# Patient Record
Sex: Female | Born: 1960
Health system: Southern US, Community
[De-identification: ages and names within clinical notes are randomized; demographics above are authoritative.]

## PROBLEM LIST (undated history)

## (undated) DIAGNOSIS — I10 Essential (primary) hypertension: Secondary | ICD-10-CM

## (undated) DIAGNOSIS — K649 Unspecified hemorrhoids: Secondary | ICD-10-CM

## (undated) DIAGNOSIS — D649 Anemia, unspecified: Secondary | ICD-10-CM

## (undated) DIAGNOSIS — D219 Benign neoplasm of connective and other soft tissue, unspecified: Secondary | ICD-10-CM

## (undated) HISTORY — DX: Anemia, unspecified: D64.9

## (undated) HISTORY — DX: Unspecified hemorrhoids: K64.9

## (undated) HISTORY — DX: Benign neoplasm of connective and other soft tissue, unspecified: D21.9

---

## 2007-08-28 ENCOUNTER — Emergency Department (HOSPITAL_COMMUNITY): Admission: EM | Admit: 2007-08-28 | Discharge: 2007-08-29 | Payer: Self-pay | Admitting: Emergency Medicine

## 2011-03-09 ENCOUNTER — Encounter: Payer: Self-pay | Admitting: Family Medicine

## 2011-03-09 ENCOUNTER — Ambulatory Visit (INDEPENDENT_AMBULATORY_CARE_PROVIDER_SITE_OTHER): Payer: BC Managed Care – PPO | Admitting: Family Medicine

## 2011-03-09 VITALS — BP 118/82 | HR 77 | Temp 98.7°F | Wt 183.0 lb

## 2011-03-09 DIAGNOSIS — J302 Other seasonal allergic rhinitis: Secondary | ICD-10-CM

## 2011-03-09 DIAGNOSIS — D649 Anemia, unspecified: Secondary | ICD-10-CM

## 2011-03-09 DIAGNOSIS — J309 Allergic rhinitis, unspecified: Secondary | ICD-10-CM

## 2011-03-09 NOTE — Patient Instructions (Addendum)
Allergies, Generic Allergies may happen from anything your body is sensitive to. This may be food, medicines, pollens, chemicals, and nearly anything around you in everyday life that produces allergens. An allergen is anything that causes an allergy producing substance. Heredity is often a factor in causing these problems. This means you may have some of the same allergies as your parents. Food allergies happen in all age groups. Food allergies are some of the most severe and life threatening. Some common food allergies are cow's milk, seafood, eggs, nuts, wheat, and soybeans. SYMPTOMS  Swelling around the mouth.   An itchy red rash or hives.   Vomiting or diarrhea.   Difficulty breathing.  SEVERE ALLERGIC REACTIONS ARE LIFE-THREATENING.  This reaction is called anaphylaxis. It can cause the mouth and throat to swell and cause difficulty with breathing and swallowing. In severe reactions only a trace amount of food (for example, peanut oil in a salad) may cause death within seconds. Seasonal allergies occur in all age groups. These are seasonal because they usually occur during the same season every year. They may be a reaction to molds, grass pollens, or tree pollens. Other causes of problems are house dust mite allergens, pet dander, and mold spores. The symptoms often consist of nasal congestion, a runny itchy nose associated with sneezing, and tearing itchy eyes. There is often an associated itching of the mouth and ears. The problems happen when you come in contact with pollens and other allergens. Allergens are the particles in the air that the body reacts to with an allergic reaction. This causes you to release allergic antibodies. Through a chain of events, these eventually cause you to release histamine into the blood stream. Although it is meant to be protective to the body, it is this release that causes your discomfort. This is why you were given anti-histamines to feel better. If you are  unable to pinpoint the offending allergen, it may be determined by skin or blood testing. Allergies cannot be cured but can be controlled with medicine. Hay fever is a collection of all or some of the seasonal allergy problems. It may often be treated with simple over-the-counter medicine such as diphenhydramine. Take medicine as directed. Do not drink alcohol or drive while taking this medicine. Check with your caregiver or package insert for child dosages. If these medicines are not effective, there are many new medicines your caregiver can prescribe. Stronger medicine such as nasal spray, eye drops, and corticosteroids may be used if the first things you try do not work well. Other treatments such as immunotherapy or desensitizing injections can be used if all else fails. Follow up with your caregiver if problems continue. These seasonal allergies are usually not life threatening. They are generally more of a nuisance that can often be handled using medicine. HOME CARE INSTRUCTIONS  If unsure what causes a reaction, keep a diary of foods eaten and symptoms that follow. Avoid foods that cause reactions.   If hives or rash are present:   Take medicine as directed.   You may use an over-the-counter antihistamine (diphenhydramine) for hives and itching as needed.   Apply cold compresses (cloths) to the skin or take baths in cool water. Avoid hot baths or showers. Heat will make a rash and itching worse.   If you are severely allergic:   Following a treatment for a severe reaction, hospitalization is often required for closer follow-up.   Wear a medic-alert bracelet or necklace stating the allergy.     You and your family must learn how to give adrenaline or use an anaphylaxis kit.   If you have had a severe reaction, always carry your anaphylaxis kit or EpiPen with you. Use this medicine as directed by your caregiver if a severe reaction is occurring. Failure to do so could have a fatal outcome.   SEE YOUR CAREGIVER IF:  You suspect a food allergy. Symptoms generally happen within 30 minutes of eating a food.   Your symptoms have not gone away within 2 days or are getting worse.   You develop new symptoms.   You want to retest yourself or your child with a food or drink you think causes an allergic reaction. Never do this if an anaphylactic reaction to that food or drink has happened before. Only do this under the care of a caregiver.  SEEK IMMEDIATE MEDICAL CARE IF:  You have difficulty breathing, are wheezing, or have a tight feeling in your chest or throat.   You have a swollen mouth, or you have hives, swelling, or itching all over your body.   You have had a severe reaction that has responded to your anaphylaxis kit or an EpiPen. These reactions may return when the medicine has worn off. These reactions should be considered life threatening.  MAKE SURE YOU:   Understand these instructions.   Will watch your condition.   Will get help right away if you are not doing well or get worse.  Document Released: 08/22/2002 Document Re-Released: 06/20/2009 Exit[employer] Patient Information 2011 [employer]. 

## 2011-03-10 ENCOUNTER — Encounter: Payer: Self-pay | Admitting: Family Medicine

## 2011-03-10 LAB — CBC WITH DIFFERENTIAL/PLATELET
Basophils Relative: 1.1 % (ref 0.0–3.0)
Eosinophils Relative: 10.7 % — ABNORMAL HIGH (ref 0.0–5.0)
HCT: 36.9 % (ref 36.0–46.0)
Lymphs Abs: 1.8 10*3/uL (ref 0.7–4.0)
Monocytes Relative: 7.9 % (ref 3.0–12.0)
Neutrophils Relative %: 42.5 % — ABNORMAL LOW (ref 43.0–77.0)
Platelets: 227 10*3/uL (ref 150.0–400.0)
RBC: 5.19 Mil/uL — ABNORMAL HIGH (ref 3.87–5.11)
WBC: 4.8 10*3/uL (ref 4.5–10.5)

## 2011-03-10 LAB — IBC PANEL
Iron: 66 ug/dL (ref 42–145)
Transferrin: 346.2 mg/dL (ref 212.0–360.0)

## 2011-03-10 LAB — FERRITIN: Ferritin: 9 ng/mL — ABNORMAL LOW (ref 10.0–291.0)

## 2011-03-10 NOTE — Progress Notes (Signed)
  Subjective:    Patient ID: Stacy Bond, female    DOB: 08/25/1960, 50 y.o.   MRN: [id number]  HPI Pt here to establish and c/o fluid in her ears. They feel full all the time --like she is under water.   Review of Systems As above-- no other complaints    Objective:   Physical Exam  Constitutional: She is oriented to person, place, and time. She appears well-developed and well-nourished.  HENT:       + fluid L ear r ear normal  Neck: Normal range of motion. Neck supple.  Cardiovascular: Normal rate, regular rhythm and normal heart sounds.   No murmur heard. Pulmonary/Chest: Effort normal and breath sounds normal. No respiratory distress. She has no wheezes. She has no rales. She exhibits no tenderness.  Neurological: She is alert and oriented to person, place, and time.  Skin: Skin is warm and dry.  Psychiatric: She has a normal mood and affect. Her behavior is normal. Judgment and thought content normal.          Assessment & Plan:

## 2011-03-16 ENCOUNTER — Encounter: Payer: Self-pay | Admitting: Family Medicine

## 2011-03-16 ENCOUNTER — Telehealth: Payer: Self-pay | Admitting: Family Medicine

## 2011-03-16 DIAGNOSIS — J302 Other seasonal allergic rhinitis: Secondary | ICD-10-CM | POA: Insufficient documentation

## 2011-03-16 DIAGNOSIS — D649 Anemia, unspecified: Secondary | ICD-10-CM | POA: Insufficient documentation

## 2011-03-16 NOTE — Telephone Encounter (Signed)
Discussed with patient and she said her ears still feels the same, she is taking a spray that she is taking in the morning and at night (Astepro and Nasonex) with no relief. She is not taking any antihistamines and wants to know what she should do. Please advise     KP

## 2011-03-16 NOTE — Telephone Encounter (Signed)
Patient was seen for her ear and it is not getting any better it is very uncomfortable. Please call.

## 2011-03-16 NOTE — Telephone Encounter (Signed)
msg left to call    KP 

## 2011-03-16 NOTE — Telephone Encounter (Signed)
Should go to [facility] first

## 2011-03-16 NOTE — Telephone Encounter (Signed)
I would like for her to take an antihistamine otc--zyrtec, allegra or claritin.  This was discussed at her ov---take it with the nasal sprays -----is pain worse

## 2011-03-16 NOTE — Telephone Encounter (Signed)
msg left to  Call the office     KP

## 2011-03-16 NOTE — Progress Notes (Signed)
  Subjective:    Patient ID: Stacy Bond, female    DOB: April 08, 1961, 50 y.o.   MRN: [id number]  HPI    Review of Systems     Objective:   Physical Exam        Assessment & Plan:  Allergies--- nasonex and astepro                   otc antihistamine and rto prn or call

## 2011-03-17 NOTE — Telephone Encounter (Signed)
Discussed wtih patient and she voiced understanding and agreed    KP

## 2011-03-20 ENCOUNTER — Encounter: Payer: Self-pay | Admitting: Family Medicine

## 2011-03-20 ENCOUNTER — Ambulatory Visit (INDEPENDENT_AMBULATORY_CARE_PROVIDER_SITE_OTHER): Payer: BC Managed Care – PPO | Admitting: Family Medicine

## 2011-03-20 DIAGNOSIS — H6520 Chronic serous otitis media, unspecified ear: Secondary | ICD-10-CM | POA: Insufficient documentation

## 2011-03-20 MED ORDER — PREDNISONE 10 MG PO TABS: ORAL_TABLET | ORAL | Status: DC

## 2011-03-20 NOTE — Assessment & Plan Note (Signed)
Prednisone taper con't nasal sprays--- nasonex an astepro Try claritin D for 7-10 days only Refer to ENT if no better

## 2011-03-20 NOTE — Patient Instructions (Signed)
Serous Otitis Media, Fluid in the Middle Ear    (Otitis Media with Effusion)  Serous otitis media is also known as otitis media with effusion (OME). It means there is fluid in the middle ear space. This space contains the bones for hearing and air. Air in the middle ear space helps to transmit sound.    The air gets there through the eustachian tube. This tube goes from the back of the throat to the middle ear space. It keeps the pressure in the middle ear the same as the outside world. It also helps to drain fluid from the middle ear space.  CAUSES  OME occurs when the eustachian tube gets blocked. Blockage can come from:   Ear infections.    Colds and other upper respiratory infections.    Allergies.    Irritants such as cigarette smoke.    Sudden changes in air pressure (such as descending in an airplane).    Enlarged adenoids.   During colds and upper respiratory infections, the middle ear space can become temporarily filled with fluid. This can happen after an ear infection also. Once the infection clears, the fluid will generally drain out of the ear through the eustachian tube. If it does not, then OME occurs.  SYMPTOMS   Hearing loss.    A feeling of fullness in the ear - but no pain.    Young children may not show any symptoms.   DIAGNOSIS   Diagnosis of OME is made by an ear exam.    Tests may be done to check on the movement of the eardrum.    Hearing exams may be done.   TREATMENT   The fluid most often goes away without treatment.    If allergy is the cause, allergy treatment may be helpful.    Fluid that persists for several months may require minor surgery. A small tube is placed in the ear drum to:    Drain the fluid.    Restore the air in the middle ear space.    In certain situations, antibiotics are used to avoid surgery.    Surgery may be done to remove enlarged adenoids (if this is the cause).   HOME CARE INSTRUCTIONS   Keep children away from tobacco smoke.     Be sure to keep follow up appointments, if any.   SEEK MEDICAL CARE IF:   Hearing is not better in 3 months.    Hearing is worse.    Ear pain.    Drainage from the ear.    Dizziness.   Document Released: 08/19/2003 Document Re-Released: 10/15/2008  Exit[employer] Patient Information 2011 [employer].

## 2011-03-20 NOTE — Progress Notes (Signed)
  Subjective:    Patient ID: Stacy Bond, female    DOB: Mar 02, 1961, 50 y.o.   MRN: [id number]  HPI Pt here to f/u R ear feeling full.  Pt is using the 2 nasal sprays and benadryl with no relief.  See last ov.   Review of Systems    as above Objective:   Physical Exam  Constitutional: She appears well-developed and well-nourished.  HENT:  Mouth/Throat: Oropharynx is clear and moist.       R TM--dull and grey  Pulmonary/Chest: Effort normal and breath sounds normal.          Assessment & Plan:

## 2011-04-24 ENCOUNTER — Other Ambulatory Visit: Payer: Self-pay | Admitting: Family Medicine

## 2011-04-24 NOTE — Telephone Encounter (Signed)
She probably means triamcinolone?

## 2011-04-24 NOTE — Telephone Encounter (Signed)
priancinolone cream apply Bid--- New Est 9/12 Rx not on med list. Please advise   KP

## 2011-04-24 NOTE — Telephone Encounter (Signed)
msg left to confirm Rx.     KP 

## 2011-04-25 MED ORDER — TRIAMCINOLONE ACETONIDE 0.1 % EX CREA: TOPICAL_CREAM | Freq: Two times a day (BID) | CUTANEOUS | Status: DC

## 2011-04-25 NOTE — Telephone Encounter (Signed)
Pt called and stated she is taking Triamcinolone 0.1%. Ok per Dr. Laury Axon to call in rx.  Sent to pharmacy.

## 2011-06-22 ENCOUNTER — Telehealth: Payer: Self-pay | Admitting: Family Medicine

## 2011-06-22 MED ORDER — TRIAMCINOLONE ACETONIDE 0.1 % EX CREA: TOPICAL_CREAM | Freq: Two times a day (BID) | CUTANEOUS | Status: DC

## 2011-06-22 NOTE — Telephone Encounter (Signed)
Faxed.   KP 

## 2011-06-22 NOTE — Telephone Encounter (Signed)
Please send refill of triamcinolone to [facility] on [address].

## 2012-01-10 ENCOUNTER — Encounter: Payer: Self-pay | Admitting: Gastroenterology

## 2012-01-10 ENCOUNTER — Telehealth: Payer: Self-pay | Admitting: Family Medicine

## 2012-01-10 DIAGNOSIS — Z1211 Encounter for screening for malignant neoplasm of colon: Secondary | ICD-10-CM

## 2012-01-10 DIAGNOSIS — Z1231 Encounter for screening mammogram for malignant neoplasm of breast: Secondary | ICD-10-CM

## 2012-01-10 NOTE — Telephone Encounter (Signed)
Pt. Called and would like a referral to have a mammogram No past mammogram on file Last OV 10.8.12 acute visit, prior was New to establish on 9.27.12 Please review and advise if I need to schedule an appt to be seen prior to referral  Patient cb# [phone]

## 2012-01-10 NOTE — Telephone Encounter (Signed)
We can put referral in but she can schedule her own.  If she is having a problem then we would need to see her so a diagnostic one can be ordered

## 2012-01-10 NOTE — Telephone Encounter (Signed)
Not having any problems, patient has never had a Mammogram or colonoscopy and is requesting both. Ok per [employer]    KP

## 2012-01-10 NOTE — Telephone Encounter (Signed)
Please advise      KP 

## 2012-02-05 ENCOUNTER — Other Ambulatory Visit: Payer: Self-pay | Admitting: Family Medicine

## 2012-02-09 ENCOUNTER — Ambulatory Visit
Admission: RE | Admit: 2012-02-09 | Discharge: 2012-02-09 | Disposition: A | Discharge status: Home / Self Care | Payer: BC Managed Care – PPO | Source: Ambulatory Visit | Attending: Family Medicine | Admitting: Family Medicine

## 2012-02-09 DIAGNOSIS — Z1231 Encounter for screening mammogram for malignant neoplasm of breast: Secondary | ICD-10-CM

## 2012-02-13 ENCOUNTER — Telehealth: Payer: Self-pay | Admitting: *Deleted

## 2012-02-13 NOTE — Telephone Encounter (Signed)
Pt did not show for previsit appointment scheduled 02/13/12. Message left to call to reschedule appointment for previsit by 5 pm today or colonoscopy appt would be cancelled and patient would need to call and reschedule both previsit and colonoscopy.

## 2012-02-20 ENCOUNTER — Ambulatory Visit (AMBULATORY_SURGERY_CENTER): Payer: BC Managed Care – PPO

## 2012-02-20 ENCOUNTER — Encounter: Payer: Self-pay | Admitting: Gastroenterology

## 2012-02-20 VITALS — Ht 65.0 in | Wt 168.7 lb

## 2012-02-20 DIAGNOSIS — R194 Change in bowel habit: Secondary | ICD-10-CM

## 2012-02-20 DIAGNOSIS — R198 Other specified symptoms and signs involving the digestive system and abdomen: Secondary | ICD-10-CM

## 2012-02-20 DIAGNOSIS — Z1211 Encounter for screening for malignant neoplasm of colon: Secondary | ICD-10-CM

## 2012-02-20 MED ORDER — MOVIPREP 100 G PO SOLR: ORAL | Status: DC

## 2012-02-27 ENCOUNTER — Encounter: Payer: Self-pay | Admitting: Gastroenterology

## 2012-02-27 ENCOUNTER — Ambulatory Visit (AMBULATORY_SURGERY_CENTER): Payer: BC Managed Care – PPO | Admitting: Gastroenterology

## 2012-02-27 VITALS — BP 153/107 | HR 79 | Temp 97.3°F | Resp 15 | Ht 65.0 in | Wt 178.0 lb

## 2012-02-27 DIAGNOSIS — Z1211 Encounter for screening for malignant neoplasm of colon: Secondary | ICD-10-CM

## 2012-02-27 MED ORDER — SODIUM CHLORIDE 0.9 % IV SOLN: 500.0000 mL | INTRAVENOUS | Status: DC

## 2012-02-27 NOTE — Progress Notes (Signed)
Patient did not experience any of the following events: a burn prior to discharge; a fall within the facility; wrong site/side/patient/procedure/implant event; or a hospital transfer or hospital admission upon discharge from the facility. (G8907) Patient did not have preoperative order for IV antibiotic SSI prophylaxis. (G8918)  

## 2012-02-27 NOTE — Op Note (Signed)
[facility] [address] [place], [address]   COLONOSCOPY PROCEDURE REPORT  PATIENT: Stacy Bond, Stacy Bond  MR#: [id number] BIRTHDATE: 1960/08/24 , 50  yrs. old GENDER: Female ENDOSCOPIST: Meryl Dare, MD, [facility] REFERRED NW:GNFAOZ Lowne, DO PROCEDURE DATE:  02/27/2012 PROCEDURE:   Colonoscopy, diagnostic ASA CLASS:   Class II INDICATIONS:average risk screening. MEDICATIONS: MAC sedation, administered by CRNA and propofol (Diprivan) 220mg  IV  DESCRIPTION OF PROCEDURE:   After the risks benefits and alternatives of the procedure were thoroughly explained, informed consent was obtained.  A digital rectal exam revealed no abnormalities of the rectum.   The LB [id number]  endoscope was introduced through the anus and advanced to the cecum, which was identified by both the appendix and ileocecal valve. No adverse events experienced.   The quality of the prep was adequate, using MoviPrep  The instrument was then slowly withdrawn as the colon was fully examined.   COLON FINDINGS: A normal appearing cecum, ileocecal valve, and appendiceal orifice were identified.  The ascending, hepatic flexure, transverse, splenic flexure, descending, sigmoid colon and rectum appeared unremarkable.  No polyps or cancers were seen. Retroflexed views revealed moderate internal hemorrhoids. The time to cecum=4 minutes 22 seconds.  Withdrawal time=12 minutes 36 seconds.  The scope was withdrawn and the procedure completed. COMPLICATIONS: There were no complications.  ENDOSCOPIC IMPRESSION: 1.  Normal colon 2.  Internal hemorrhoids  RECOMMENDATIONS: 1.  Continue current colorectal screening recommendations for "routine risk" patients with a repeat colonoscopy in 10 years.   eSigned:  Meryl Dare, MD, [facility] 02/27/2012 9:20 AM

## 2012-02-27 NOTE — Progress Notes (Signed)
The pt tolerated the colonoscopy very well. Maw   

## 2012-02-27 NOTE — Patient Instructions (Addendum)

## 2012-02-28 ENCOUNTER — Telehealth: Payer: Self-pay

## 2012-02-28 NOTE — Telephone Encounter (Signed)
  Follow up Call-  Call back number 02/27/2012  Post procedure Call Back phone  # [phone]  Permission to leave phone message Yes     Patient questions:  Do you have a fever, pain , or abdominal swelling? no Pain Score  0 *  Have you tolerated food without any problems? yes  Have you been able to return to your normal activities? yes  Do you have any questions about your discharge instructions: Diet   no Medications  no Follow up visit  no  Do you have questions or concerns about your Care? no  Actions: * If pain score is 4 or above: No action needed, pain <4. No problems from the pt. Maw

## 2012-09-03 ENCOUNTER — Other Ambulatory Visit: Payer: Self-pay | Admitting: Family Medicine

## 2012-10-18 ENCOUNTER — Other Ambulatory Visit: Payer: Self-pay | Admitting: Family Medicine

## 2012-10-31 ENCOUNTER — Ambulatory Visit (INDEPENDENT_AMBULATORY_CARE_PROVIDER_SITE_OTHER): Payer: BC Managed Care – PPO | Admitting: Family Medicine

## 2012-10-31 ENCOUNTER — Encounter: Payer: Self-pay | Admitting: Family Medicine

## 2012-10-31 VITALS — BP 120/70 | HR 85 | Temp 98.7°F | Wt 162.6 lb

## 2012-10-31 DIAGNOSIS — L309 Dermatitis, unspecified: Secondary | ICD-10-CM

## 2012-10-31 DIAGNOSIS — L259 Unspecified contact dermatitis, unspecified cause: Secondary | ICD-10-CM

## 2012-10-31 MED ORDER — TRIAMCINOLONE ACETONIDE 0.1 % EX CREA: TOPICAL_CREAM | CUTANEOUS | Status: DC

## 2012-10-31 NOTE — Progress Notes (Signed)
  Subjective:    Patient ID: Stacy Bond, female    DOB: 11-18-1960, 52 y.o.   MRN: [id number]  HPI Pt here for refill of cream for eczema.  It has been much better.  She just gets it around her neck now when she is stressed.  She is getting ready to go to [place] for her brothers funeral.  He was hit by a car.  She has been understandably upset since she found out.    Review of Systems    as above Objective:   Physical Exam  BP 120/70  Pulse 85  Temp(Src) 98.7 F (37.1 C) (Oral)  Wt 162 lb 9.6 oz (73.755 kg)  BMI 27.06 kg/m2  SpO2 99% General appearance: alert, cooperative, appears stated age and no distress Skin: eczema - neck      Assessment & Plan:

## 2012-10-31 NOTE — Patient Instructions (Addendum)

## 2013-02-24 ENCOUNTER — Encounter: Payer: BC Managed Care – PPO | Admitting: Family Medicine

## 2013-02-24 DIAGNOSIS — Z0289 Encounter for other administrative examinations: Secondary | ICD-10-CM

## 2013-04-11 ENCOUNTER — Telehealth: Payer: Self-pay

## 2013-04-11 NOTE — Telephone Encounter (Addendum)
Left message for call back Non identifiable  Medication and allergies: reviewed and updated  90 day supply/mail order: na [facility]   Immunizations due: declines flu vaccine   A/P:   No changes to FH or PSH MMG--01/2012--neg CCS--02/2012--Dr Stark--next 2023 Pap--approx 08/2011--no abnormal paps per patient--we do not have a copy on file  To Discuss with Provider: Lambert Mody pain/tightness in chest at times

## 2013-04-14 ENCOUNTER — Ambulatory Visit (INDEPENDENT_AMBULATORY_CARE_PROVIDER_SITE_OTHER): Payer: BC Managed Care – PPO | Admitting: Family Medicine

## 2013-04-14 ENCOUNTER — Encounter: Payer: Self-pay | Admitting: Family Medicine

## 2013-04-14 VITALS — BP 118/78 | HR 94 | Temp 97.9°F | Ht 65.0 in | Wt 167.0 lb

## 2013-04-14 DIAGNOSIS — R079 Chest pain, unspecified: Secondary | ICD-10-CM

## 2013-04-14 DIAGNOSIS — F411 Generalized anxiety disorder: Secondary | ICD-10-CM

## 2013-04-14 MED ORDER — ALPRAZOLAM 0.25 MG PO TABS: 0.2500 mg | ORAL_TABLET | Freq: Three times a day (TID) | ORAL | Status: DC | PRN

## 2013-04-14 NOTE — Progress Notes (Signed)
  Subjective:    Stacy Bond is a 52 y.o. female who presents for evaluation of chest pain. Onset was several  months ago--- she noticed it started around her brothers funeral.   Symptoms have been unchanged since that time. The patient describes the pain as sharp and does not radiate. Patient rates pain as a 4/10 in intensity. Associated symptoms are: chest pain. Aggravating factors are: emotional stress. Alleviating factors are: aspiri. Patient's cardiac risk factors are: none. Patient's risk factors for DVT/PE: none. Previous cardiac testing: none.  The following portions of the patient's history were reviewed and updated as appropriate:  She  has a past medical history of Fibroid; Anemia; and Hemorrhoids. She  does not have any pertinent problems on file. She  has no past surgical history on file. Her family history includes Diabetes in her father; Hypertension in her mother. She  reports that she has never smoked. She has never used smokeless tobacco. She reports that she drinks about 1.2 ounces of alcohol per week. She reports that she does not use illicit drugs. She has a current medication list which includes the following prescription(s): triamcinolone cream. Current Outpatient Prescriptions on File Prior to Visit  Medication Sig Dispense Refill  . triamcinolone cream (KENALOG) 0.1 % apply cream topically twice daily  90 g  3   No current facility-administered medications on file prior to visit.   She has No Known Allergies..  Review of Systems Pertinent items are noted in HPI.    Objective:    BP 118/78  Pulse 94  Temp(Src) 97.9 F (36.6 C) (Oral)  Ht 5\' 5"  (1.651 m)  Wt 167 lb (75.751 kg)  BMI 27.79 kg/m2  SpO2 97% General appearance: alert, cooperative, appears stated age and no distress Neck: no adenopathy, no carotid bruit, no JVD, supple, symmetrical, trachea midline and thyroid not enlarged, symmetric, no tenderness/mass/nodules Lungs: clear to auscultation  bilaterally Heart: S1, S2 normal and 1-2/6 murmur Neurologic: Alert and oriented X 3, normal strength and tone. Normal symmetric reflexes. Normal coordination and gait Psych-- anxious especially when talking about her brother,  No suicidal  Cardiographics ECG: no prior ECG and see ekg  Imaging Chest x-ray: not indicated    Assessment:    Chest pain, suspected etiology: anxiety ---in part but + abn ekg and hx murmur   Plan:     2d echo Refer to cardiology Xanax 0.25 mg 1 po tid prn  rto for labs

## 2013-04-14 NOTE — Patient Instructions (Addendum)
rto 4-6 weeks for fasting labs and cpe     Chest Pain (Nonspecific) It is often hard to give a specific diagnosis for the cause of chest pain. There is always a chance that your pain could be related to something serious, such as a heart attack or a blood clot in the lungs. You need to follow up with your caregiver for further evaluation. CAUSES   Heartburn.  Pneumonia or bronchitis.  Anxiety or stress.  Inflammation around your heart (pericarditis) or lung (pleuritis or pleurisy).  A blood clot in the lung.  A collapsed lung (pneumothorax). It can develop suddenly on its own (spontaneous pneumothorax) or from injury (trauma) to the chest.  Shingles infection (herpes zoster virus). The chest wall is composed of bones, muscles, and cartilage. Any of these can be the source of the pain.  The bones can be bruised by injury.  The muscles or cartilage can be strained by coughing or overwork.  The cartilage can be affected by inflammation and become sore (costochondritis). DIAGNOSIS  Lab tests or other studies, such as X-rays, electrocardiography, stress testing, or cardiac imaging, may be needed to find the cause of your pain.  TREATMENT   Treatment depends on what may be causing your chest pain. Treatment may include:  Acid blockers for heartburn.  Anti-inflammatory medicine.  Pain medicine for inflammatory conditions.  Antibiotics if an infection is present.  You may be advised to change lifestyle habits. This includes stopping smoking and avoiding alcohol, caffeine, and chocolate.  You may be advised to keep your head raised (elevated) when sleeping. This reduces the chance of acid going backward from your stomach into your esophagus.  Most of the time, nonspecific chest pain will improve within 2 to 3 days with rest and mild pain medicine. HOME CARE INSTRUCTIONS   If antibiotics were prescribed, take your antibiotics as directed. Finish them even if you start to feel  better.  For the next few days, avoid physical activities that bring on chest pain. Continue physical activities as directed.  Do not smoke.  Avoid drinking alcohol.  Only take over-the-counter or prescription medicine for pain, discomfort, or fever as directed by your caregiver.  Follow your caregiver's suggestions for further testing if your chest pain does not go away.  Keep any follow-up appointments you made. If you do not go to an appointment, you could develop lasting (chronic) problems with pain. If there is any problem keeping an appointment, you must call to reschedule. SEEK MEDICAL CARE IF:   You think you are having problems from the medicine you are taking. Read your medicine instructions carefully.  Your chest pain does not go away, even after treatment.  You develop a rash with blisters on your chest. SEEK IMMEDIATE MEDICAL CARE IF:   You have increased chest pain or pain that spreads to your arm, neck, jaw, back, or abdomen.  You develop shortness of breath, an increasing cough, or you are coughing up blood.  You have severe back or abdominal pain, feel nauseous, or vomit.  You develop severe weakness, fainting, or chills.  You have a fever. THIS IS AN EMERGENCY. Do not wait to see if the pain will go away. Get medical help at once. Call your local emergency services (911 in [place] Do not drive yourself to the hospital. MAKE SURE YOU:   Understand these instructions.  Will watch your condition.  Will get help right away if you are not doing well or get worse. Document Released:  03/08/2005 Document Revised: 08/21/2011 Document Reviewed: 01/02/2008 [facility] Patient Information 2014 [place], [place].

## 2013-04-15 ENCOUNTER — Ambulatory Visit (INDEPENDENT_AMBULATORY_CARE_PROVIDER_SITE_OTHER): Payer: BC Managed Care – PPO | Admitting: Cardiology

## 2013-04-15 ENCOUNTER — Encounter: Payer: Self-pay | Admitting: *Deleted

## 2013-04-15 ENCOUNTER — Encounter: Payer: Self-pay | Admitting: Cardiology

## 2013-04-15 VITALS — BP 120/80 | HR 80 | Ht 65.0 in | Wt 169.0 lb

## 2013-04-15 DIAGNOSIS — R079 Chest pain, unspecified: Secondary | ICD-10-CM

## 2013-04-15 DIAGNOSIS — R0789 Other chest pain: Secondary | ICD-10-CM

## 2013-04-15 NOTE — Patient Instructions (Signed)
Your physician recommends that you schedule a follow-up appointment in: AS NEEDED PENDING TEST RESULTS  Your physician has requested that you have a stress echocardiogram. For further information please visit [email or web address]. Please follow instruction sheet as given.    

## 2013-04-15 NOTE — Assessment & Plan Note (Signed)
Symptoms atypical. Plan stress echocardiogram for risk stratification.

## 2013-04-15 NOTE — Progress Notes (Signed)
     HPI: 52 year old female for evaluation of chest pain. Over the past 3 months she has had intermittent chest pain. It is substernal without radiation. No associated symptoms. The pain is not exertional, pleuritic or related to food. It lasts 2 minutes and resolves spontaneously. She does not have exertional chest pain. No dyspnea on exertion, orthopnea, PND or syncope. Because of the above we were asked to evaluate.  Current Outpatient Prescriptions  Medication Sig Dispense Refill  . ALPRAZolam (XANAX) 0.25 MG tablet Take 1 tablet (0.25 mg total) by mouth 3 (three) times daily as needed for sleep.  20 tablet  0  . triamcinolone cream (KENALOG) 0.1 % apply cream topically twice daily  90 g  3   No current facility-administered medications for this visit.    No Known Allergies  Past Medical History  Diagnosis Date  . Fibroid   . Anemia   . Hemorrhoids     History reviewed. No pertinent past surgical history.  History   Social History  . Marital Status: Married    Spouse Name: N/A    Number of Children: 1  . Years of Education: N/A   Occupational History  .      Insurance   Social History Main Topics  . Smoking status: Never Smoker   . Smokeless tobacco: Never Used  . Alcohol Use: 1.2 oz/week    2 Glasses of wine per week  . Drug Use: No  . Sexual Activity: Yes    Partners: Male   Other Topics Concern  . Not on file   Social History Narrative  . No narrative on file    Family History  Problem Relation Age of Onset  . Diabetes Father   . Hypertension Mother     ROS: no fevers or chills, productive cough, hemoptysis, dysphasia, odynophagia, melena, hematochezia, dysuria, hematuria, rash, seizure activity, orthopnea, PND, pedal edema, claudication. Remaining systems are negative.  Physical Exam:   Blood pressure 120/80, pulse 80, height 5\' 5"  (1.651 m), weight 169 lb (76.658 kg).  General:  Well developed/well nourished in NAD Skin warm/dry Patient not  depressed No peripheral clubbing Back-normal HEENT-normal/normal eyelids Neck supple/normal carotid upstroke bilaterally; no bruits; no JVD; no thyromegaly chest - CTA/ normal expansion CV - RRR/normal S1 and S2; no murmurs, rubs or gallops;  PMI nondisplaced Abdomen -NT/ND, no HSM, no mass, + bowel sounds, no bruit 2+ femoral pulses, no bruits Ext-no edema, chords, 2+ DP Neuro-grossly nonfocal  ECG 04/14/2013-sinus rhythm with nonspecific ST changes.

## 2013-04-18 ENCOUNTER — Other Ambulatory Visit: Payer: BC Managed Care – PPO

## 2013-05-02 ENCOUNTER — Ambulatory Visit (HOSPITAL_COMMUNITY): Payer: BC Managed Care – PPO

## 2013-05-02 ENCOUNTER — Ambulatory Visit (HOSPITAL_COMMUNITY): Payer: BC Managed Care – PPO | Attending: Cardiology | Admitting: Cardiology

## 2013-05-02 ENCOUNTER — Encounter: Payer: Self-pay | Admitting: Cardiology

## 2013-05-02 DIAGNOSIS — R079 Chest pain, unspecified: Secondary | ICD-10-CM | POA: Insufficient documentation

## 2013-05-02 DIAGNOSIS — Q788 Other specified osteochondrodysplasias: Secondary | ICD-10-CM

## 2013-05-02 DIAGNOSIS — R0789 Other chest pain: Secondary | ICD-10-CM

## 2013-05-02 NOTE — Progress Notes (Signed)
Stress Echo Performed.

## 2013-05-16 ENCOUNTER — Encounter: Payer: Self-pay | Admitting: Lab

## 2013-05-19 ENCOUNTER — Other Ambulatory Visit: Payer: BC Managed Care – PPO

## 2013-05-22 ENCOUNTER — Encounter: Payer: BC Managed Care – PPO | Admitting: Family Medicine

## 2013-06-30 ENCOUNTER — Telehealth: Payer: Self-pay | Admitting: *Deleted

## 2013-06-30 NOTE — Telephone Encounter (Signed)
Needs to be seen, either here, at the travel clinic @ [facility] or @ [facility] ( they frequently do travel medicine).

## 2013-06-30 NOTE — Telephone Encounter (Signed)
Patient states that she is leaving to go to [place] on Wednesday  and was wondering if she is to take any specific medications with her traveling out of country. Please advise. SW

## 2013-07-01 NOTE — Telephone Encounter (Signed)
Patient given contact info for [facility].

## 2013-08-01 ENCOUNTER — Encounter: Payer: BC Managed Care – PPO | Admitting: Family Medicine

## 2013-08-01 DIAGNOSIS — Z0289 Encounter for other administrative examinations: Secondary | ICD-10-CM

## 2013-09-18 ENCOUNTER — Encounter: Payer: BC Managed Care – PPO | Admitting: Family Medicine

## 2013-10-07 ENCOUNTER — Encounter: Payer: BC Managed Care – PPO | Admitting: Family Medicine

## 2013-10-13 ENCOUNTER — Encounter: Payer: Self-pay | Admitting: Family Medicine

## 2013-10-13 ENCOUNTER — Other Ambulatory Visit (HOSPITAL_COMMUNITY)
Admission: RE | Admit: 2013-10-13 | Discharge: 2013-10-13 | Disposition: A | Discharge status: Home / Self Care | Payer: BC Managed Care – PPO | Source: Ambulatory Visit | Attending: Family Medicine | Admitting: Family Medicine

## 2013-10-13 ENCOUNTER — Ambulatory Visit (INDEPENDENT_AMBULATORY_CARE_PROVIDER_SITE_OTHER): Payer: BC Managed Care – PPO | Admitting: Family Medicine

## 2013-10-13 VITALS — BP 122/80 | HR 72 | Temp 98.2°F | Wt 165.0 lb

## 2013-10-13 DIAGNOSIS — N760 Acute vaginitis: Secondary | ICD-10-CM | POA: Insufficient documentation

## 2013-10-13 DIAGNOSIS — Z113 Encounter for screening for infections with a predominantly sexual mode of transmission: Secondary | ICD-10-CM | POA: Insufficient documentation

## 2013-10-13 DIAGNOSIS — B9689 Other specified bacterial agents as the cause of diseases classified elsewhere: Secondary | ICD-10-CM

## 2013-10-13 DIAGNOSIS — N949 Unspecified condition associated with female genital organs and menstrual cycle: Secondary | ICD-10-CM

## 2013-10-13 DIAGNOSIS — A499 Bacterial infection, unspecified: Secondary | ICD-10-CM

## 2013-10-13 DIAGNOSIS — D229 Melanocytic nevi, unspecified: Secondary | ICD-10-CM

## 2013-10-13 DIAGNOSIS — D239 Other benign neoplasm of skin, unspecified: Secondary | ICD-10-CM

## 2013-10-13 DIAGNOSIS — R102 Pelvic and perineal pain: Secondary | ICD-10-CM

## 2013-10-13 LAB — POCT URINALYSIS DIPSTICK
BILIRUBIN UA: NEGATIVE
GLUCOSE UA: NEGATIVE
KETONES UA: NEGATIVE
Nitrite, UA: NEGATIVE
Protein, UA: NEGATIVE
UROBILINOGEN UA: 1
pH, UA: 7.5

## 2013-10-13 MED ORDER — METRONIDAZOLE 0.75 % VA GEL: VAGINAL | Status: DC

## 2013-10-13 NOTE — Progress Notes (Signed)
Pre visit review using our clinic review tool, if applicable. No additional management support is needed unless otherwise documented below in the visit note. 

## 2013-10-13 NOTE — Patient Instructions (Signed)

## 2013-10-13 NOTE — Progress Notes (Signed)
  Subjective:    Stacy Bond is a 53 y.o. female who presents with c/;o vaginal d/c and odor x few days.   Sexual history reviewed with the patient. STI Exposure: denies knowledge of risky exposure. Previous history of STI none. Current symptoms none---  D/c seemed to stop 2 days ago but she still feels bloated.  Contraception: none Menstrual History: OB History   Grav Para Term Preterm Abortions TAB SAB Ect Mult Living                  Menarche age:  No LMP recorded. Patient is not currently having periods (Reason: Perimenopausal).    The following portions of the patient's history were reviewed and updated as appropriate:  She  has a past medical history of Fibroid; Anemia; and Hemorrhoids. She  does not have any pertinent problems on file. She  has no past surgical history on file. Her family history includes Diabetes in her father; Hypertension in her mother. She  reports that she has never smoked. She has never used smokeless tobacco. She reports that she drinks about 1.2 ounces of alcohol per week. She reports that she does not use illicit drugs. She has a current medication list which includes the following prescription(s): triamcinolone cream. Current Outpatient Prescriptions on File Prior to Visit  Medication Sig Dispense Refill  . triamcinolone cream (KENALOG) 0.1 % apply cream topically twice daily  90 g  3   No current facility-administered medications on file prior to visit.   She has No Known Allergies..  Review of Systems Pertinent items are noted in HPI.    Objective:    BP 122/80  Pulse 72  Temp(Src) 98.2 F (36.8 C) (Oral)  Wt 165 lb (74.844 kg)  SpO2 99% General:   alert, cooperative, appears stated age and no distress  Lymph Nodes:   Cervical, supraclavicular, and axillary nodes normal.  Pelvis:  Vulva and vagina appear normal. Bimanual exam reveals normal uterus and adnexa. Vaginal: discharge, yellow and maloderous  Cultures:  GC and Chlamydia  genprobes and bacterial culture     Assessment:    Very low risk of STD exposure --- most likely BV   Plan:  metrogel 1 app pv qhs x 5 days  See orders. Will call pt with results RTC PRN

## 2013-10-15 LAB — CERVICOVAGINAL ANCILLARY ONLY
CHLAMYDIA, DNA PROBE: NEGATIVE
NEISSERIA GONORRHEA: NEGATIVE
WET PREP (BD AFFIRM): NEGATIVE
WET PREP (BD AFFIRM): NEGATIVE
Wet Prep (BD Affirm): NEGATIVE

## 2013-10-15 LAB — URINE CULTURE
COLONY COUNT: NO GROWTH
ORGANISM ID, BACTERIA: NO GROWTH

## 2013-10-16 ENCOUNTER — Ambulatory Visit (HOSPITAL_BASED_OUTPATIENT_CLINIC_OR_DEPARTMENT_OTHER): Payer: BC Managed Care – PPO

## 2013-10-27 ENCOUNTER — Encounter: Payer: BC Managed Care – PPO | Admitting: Family Medicine

## 2013-12-24 ENCOUNTER — Encounter: Payer: BC Managed Care – PPO | Admitting: Family Medicine

## 2013-12-24 DIAGNOSIS — Z0289 Encounter for other administrative examinations: Secondary | ICD-10-CM

## 2014-08-10 ENCOUNTER — Ambulatory Visit (INDEPENDENT_AMBULATORY_CARE_PROVIDER_SITE_OTHER): Payer: BLUE CROSS/BLUE SHIELD | Admitting: Family Medicine

## 2014-08-10 ENCOUNTER — Encounter: Payer: Self-pay | Admitting: Family Medicine

## 2014-08-10 VITALS — BP 114/80 | HR 75 | Temp 98.9°F | Wt 173.2 lb

## 2014-08-10 DIAGNOSIS — L03012 Cellulitis of left finger: Secondary | ICD-10-CM

## 2014-08-10 DIAGNOSIS — Z23 Encounter for immunization: Secondary | ICD-10-CM

## 2014-08-10 DIAGNOSIS — L309 Dermatitis, unspecified: Secondary | ICD-10-CM

## 2014-08-10 MED ORDER — TRIAMCINOLONE ACETONIDE 0.1 % EX CREA: TOPICAL_CREAM | CUTANEOUS | Status: DC

## 2014-08-10 MED ORDER — CEPHALEXIN 500 MG PO CAPS: 500.0000 mg | ORAL_CAPSULE | Freq: Four times a day (QID) | ORAL | Status: DC

## 2014-08-10 NOTE — Progress Notes (Signed)
   Subjective:    Patient ID: Stacy Bond, female    DOB: 1961-05-16, 54 y.o.   MRN: [id number]  HPI  Patient here c/o pain L index finger and draining pus --- now better but still painful.  She also needs a refill on triamcinolone.    Past Medical History  Diagnosis Date  . Fibroid   . Anemia   . Hemorrhoids     Review of Systems  Constitutional: Negative for activity change, appetite change and unexpected weight change.  Respiratory: Negative for cough and shortness of breath.   Cardiovascular: Negative for chest pain and palpitations.  Skin:       L index finger-- tenderness lat nail-- pt said it was swollen and more tender but with soaking etc it drained pus a few days ago -- but still tender Symptoms started 2 weeks ago  Psychiatric/Behavioral: Negative for behavioral problems and dysphoric mood. The patient is not nervous/anxious.        Objective:    Physical Exam  Constitutional: She appears well-developed and well-nourished.  Cardiovascular: Normal rate and normal heart sounds.   No murmur heard. Pulmonary/Chest: Effort normal and breath sounds normal. No respiratory distress. She has no wheezes.  Skin: Skin is warm. There is erythema.  L index finger-- tenderness lat edges --- no drainage    BP 114/80 mmHg  Pulse 75  Temp(Src) 98.9 F (37.2 C) (Oral)  Wt 173 lb 3.2 oz (78.563 kg)  SpO2 98% Wt Readings from Last 3 Encounters:  08/10/14 173 lb 3.2 oz (78.563 kg)  10/13/13 165 lb (74.844 kg)  04/15/13 169 lb (76.658 kg)     Lab Results  Component Value Date   WBC 4.8 03/09/2011   HGB 11.6* 03/09/2011   HCT 36.9 03/09/2011   PLT 227.0 03/09/2011    No results found.     Assessment & Plan:   Problem List Items Addressed This Visit    None    Visit Diagnoses    Paronychia of finger of left hand    -  Primary    Relevant Medications    cephALEXin (KEFLEX) capsule    Eczema        Relevant Medications    triamcinolone cream (KENALOG) 0.1 %          Garnet Koyanagi, DO

## 2014-08-10 NOTE — Addendum Note (Signed)
Addended by: Rockwell Germany on: 08/10/2014 01:25 PM   Modules accepted: Orders

## 2014-08-10 NOTE — Progress Notes (Signed)
Pre visit review using our clinic review tool, if applicable. No additional management support is needed unless otherwise documented below in the visit note. 

## 2014-08-10 NOTE — Patient Instructions (Signed)

## 2014-11-20 ENCOUNTER — Other Ambulatory Visit: Payer: Self-pay | Admitting: Family Medicine

## 2014-11-20 DIAGNOSIS — Z1239 Encounter for other screening for malignant neoplasm of breast: Secondary | ICD-10-CM

## 2014-11-20 NOTE — Progress Notes (Signed)
yes

## 2014-11-20 NOTE — Progress Notes (Signed)
Order placed for Screening Mammogram

## 2015-06-17 ENCOUNTER — Telehealth: Payer: Self-pay | Admitting: Family Medicine

## 2015-06-17 DIAGNOSIS — L309 Dermatitis, unspecified: Secondary | ICD-10-CM

## 2015-06-17 MED ORDER — TRIAMCINOLONE ACETONIDE 0.1 % EX CREA
TOPICAL_CREAM | CUTANEOUS | Status: DC
Start: 2015-06-17 — End: 2016-03-17

## 2015-06-17 NOTE — Telephone Encounter (Signed)
Caller name: Nguyet   Relationship to patient: Self   Can be reached: [phone]  Pharmacy:  [facility] [place] ([place] [place]  Reason for call: pt is requesting a refill on her triamcinolone cream

## 2015-06-17 NOTE — Telephone Encounter (Signed)
Rx faxed.    KP 

## 2015-07-26 ENCOUNTER — Telehealth: Payer: Self-pay | Admitting: *Deleted

## 2015-07-26 NOTE — Telephone Encounter (Signed)
No answer, voicemail box not set up

## 2015-09-20 ENCOUNTER — Telehealth: Payer: Self-pay

## 2015-09-20 NOTE — Telephone Encounter (Signed)
Pre visit call completed 

## 2015-09-21 ENCOUNTER — Encounter: Payer: Self-pay | Admitting: Family Medicine

## 2015-09-21 ENCOUNTER — Other Ambulatory Visit (HOSPITAL_COMMUNITY)
Admission: RE | Admit: 2015-09-21 | Discharge: 2015-09-21 | Disposition: A | Discharge status: Home / Self Care | Payer: BLUE CROSS/BLUE SHIELD | Source: Ambulatory Visit | Attending: Family Medicine | Admitting: Family Medicine

## 2015-09-21 ENCOUNTER — Ambulatory Visit (INDEPENDENT_AMBULATORY_CARE_PROVIDER_SITE_OTHER): Payer: BLUE CROSS/BLUE SHIELD | Admitting: Family Medicine

## 2015-09-21 ENCOUNTER — Encounter: Payer: BLUE CROSS/BLUE SHIELD | Admitting: Family Medicine

## 2015-09-21 VITALS — BP 122/84 | HR 66 | Temp 98.7°F | Ht 65.0 in | Wt 168.0 lb

## 2015-09-21 DIAGNOSIS — Z1159 Encounter for screening for other viral diseases: Secondary | ICD-10-CM | POA: Diagnosis not present

## 2015-09-21 DIAGNOSIS — Z0000 Encounter for general adult medical examination without abnormal findings: Secondary | ICD-10-CM | POA: Diagnosis not present

## 2015-09-21 DIAGNOSIS — D649 Anemia, unspecified: Secondary | ICD-10-CM

## 2015-09-21 DIAGNOSIS — Z01419 Encounter for gynecological examination (general) (routine) without abnormal findings: Secondary | ICD-10-CM | POA: Insufficient documentation

## 2015-09-21 DIAGNOSIS — Z114 Encounter for screening for human immunodeficiency virus [HIV]: Secondary | ICD-10-CM | POA: Diagnosis not present

## 2015-09-21 DIAGNOSIS — Z1239 Encounter for other screening for malignant neoplasm of breast: Secondary | ICD-10-CM

## 2015-09-21 DIAGNOSIS — Z124 Encounter for screening for malignant neoplasm of cervix: Secondary | ICD-10-CM

## 2015-09-21 DIAGNOSIS — Z1151 Encounter for screening for human papillomavirus (HPV): Secondary | ICD-10-CM

## 2015-09-21 NOTE — Progress Notes (Signed)
Pre visit review using our clinic review tool, if applicable. No additional management support is needed unless otherwise documented below in the visit note. 

## 2015-09-21 NOTE — Progress Notes (Signed)
Subjective:     Stacy Bond is a 55 y.o. female and is here for a comprehensive physical exam. The patient reports no problems.  Social History   Social History  . Marital Status: Married    Spouse Name: N/A  . Number of Children: 1  . Years of Education: N/A   Occupational History  . Not on file.   Social History Main Topics  . Smoking status: Never Smoker   . Smokeless tobacco: Never Used  . Alcohol Use: 1.2 oz/week    2 Glasses of wine per week  . Drug Use: No  . Sexual Activity:    Partners: Male   Other Topics Concern  . Not on file   Social History Narrative   Health Maintenance  Topic Date Due  . Hepatitis C Screening  Apr 08, 1961  . HIV Screening  04/19/1977  . MAMMOGRAM  02/08/2014  . PAP SMEAR  08/11/2014  . INFLUENZA VACCINE  01/11/2016  . COLONOSCOPY  02/26/2022  . TETANUS/TDAP  08/09/2024    The following portions of the patient's history were reviewed and updated as appropriate:  She  has a past medical history of Fibroid; Anemia; and Hemorrhoids. She  does not have any pertinent problems on file. She  has no past surgical history on file. Her family history includes Clotting disorder in her daughter; Diabetes in her father; Hypertension in her mother; Pulmonary embolism in her mother. She  reports that she has never smoked. She has never used smokeless tobacco. She reports that she drinks about 1.2 oz of alcohol per week. She reports that she does not use illicit drugs. She has a current medication list which includes the following prescription(s): triamcinolone cream. Current Outpatient Prescriptions on File Prior to Visit  Medication Sig Dispense Refill  . triamcinolone cream (KENALOG) 0.1 % apply cream topically twice daily 90 g 1   No current facility-administered medications on file prior to visit.   She has No Known Allergies..  Review of Systems Review of Systems  Constitutional: Negative for activity change, appetite change and  fatigue.  HENT: Negative for hearing loss, congestion, tinnitus and ear discharge.  dentist q40m Eyes: Negative for visual disturbance (see optho q1y -- vision corrected to 20/20 with glasses).  Respiratory: Negative for cough, chest tightness and shortness of breath.   Cardiovascular: Negative for chest pain, palpitations and leg swelling.  Gastrointestinal: Negative for abdominal pain, diarrhea, constipation and abdominal distention.  Genitourinary: Negative for urgency, frequency, decreased urine volume and difficulty urinating.  Musculoskeletal: Negative for back pain, arthralgias and gait problem.  Skin: Negative for color change, pallor and rash.  Neurological: Negative for dizziness, light-headedness, numbness and headaches.  Hematological: Negative for adenopathy. Does not bruise/bleed easily.  Psychiatric/Behavioral: Negative for suicidal ideas, confusion, sleep disturbance, self-injury, dysphoric mood, decreased concentration and agitation.       Objective:    BP 122/84 mmHg  Pulse 66  Temp(Src) 98.7 F (37.1 C) (Oral)  Ht 5\' 5"  (1.651 m)  Wt 168 lb (76.204 kg)  BMI 27.96 kg/m2  SpO2 98% General appearance: alert, cooperative, appears stated age and no distress Head: Normocephalic, without obvious abnormality, atraumatic Eyes: Ears: normal TM's and external ear canals both ears Nose: Nares normal. Septum midline. Mucosa normal. No drainage or sinus tenderness., no discharge Throat: lips, mucosa, and tongue normal; teeth and gums normal Neck: no adenopathy, no carotid bruit, no JVD, supple, symmetrical, trachea midline and thyroid not enlarged, symmetric, no tenderness/mass/nodules Back: symmetric, no curvature. ROM  normal. No CVA tenderness. Lungs: clear to auscultation bilaterally Breasts: normal appearance, no masses or tenderness Heart: regular rate and rhythm, S1, S2 normal, no murmur, click, rub or gallop Abdomen: soft, non-tender; bowel sounds normal; no masses,   no organomegaly Pelvic: no ext lesions, no d/c, no cmt no adnexal masses, pap was done Rectal-- heme neg brown stools Extremities: extremities normal, atraumatic, no cyanosis or edema Pulses: 2+ and symmetric Skin: Skin color, texture, turgor normal. No rashes or lesions Lymph nodes: Cervical, supraclavicular, and axillary nodes normal. Neurologic: Alert and oriented X 3, normal strength and tone. Normal symmetric reflexes. Normal coordination and gait Psych- no depression, no anxiety      Assessment:    Healthy female exam.      Plan:    ghm utd Check labs See After Visit Summary for Counseling Recommendations     1. Breast cancer screening   - Hepatitis C Antibody - HIV antibody (with reflex) - Lipid panel - Basic metabolic panel - TSH - CBC with Differential/Platelet - Hepatic function panel  2. Screening for HIV (human immunodeficiency virus)   - Hepatitis C Antibody - HIV antibody (with reflex) - Lipid panel - Basic metabolic panel - TSH - CBC with Differential/Platelet - Hepatic function panel  3. Need for hepatitis C screening test   - Hepatitis C Antibody - HIV antibody (with reflex) - Lipid panel - Basic metabolic panel - TSH - CBC with Differential/Platelet - Hepatic function panel  4. Anemia, unspecified anemia type   - Hepatitis C Antibody - HIV antibody (with reflex) - Lipid panel - Basic metabolic panel - TSH - CBC with Differential/Platelet - Hepatic function panel  5. Preventative health care   - Hepatitis C Antibody - HIV antibody (with reflex) - Lipid panel - Basic metabolic panel - TSH - CBC with Differential/Platelet - Hepatic function panel - Cytology - PAP  6. Need for hepatitis C screening test   - Hepatitis C Antibody - HIV antibody (with reflex) - Lipid panel - Basic metabolic panel - TSH - CBC with Differential/Platelet - Hepatic function panel  7. Screening for human papillomavirus    8. Pap smear  for cervical cancer screening   - Cytology - PAP

## 2015-09-21 NOTE — Patient Instructions (Signed)
Preventive Care for Adults, Female A healthy lifestyle and preventive care can promote health and wellness. Preventive health guidelines for women include the following key practices.  A routine yearly physical is a good way to check with your health care provider about your health and preventive screening. It is a chance to share any concerns and updates on your health and to receive a thorough exam.  Visit your dentist for a routine exam and preventive care every 6 months. Brush your teeth twice a day and floss once a day. Good oral hygiene prevents tooth decay and gum disease.  The frequency of eye exams is based on your age, health, family medical history, use of contact lenses, and other factors. Follow your health care provider's recommendations for frequency of eye exams.  Eat a healthy diet. Foods like vegetables, fruits, whole grains, low-fat dairy products, and lean protein foods contain the nutrients you need without too many calories. Decrease your intake of foods high in solid fats, added sugars, and salt. Eat the right amount of calories for you.Get information about a proper diet from your health care provider, if necessary.  Regular physical exercise is one of the most important things you can do for your health. Most adults should get at least 150 minutes of moderate-intensity exercise (any activity that increases your heart rate and causes you to sweat) each week. In addition, most adults need muscle-strengthening exercises on 2 or more days a week.  Maintain a healthy weight. The body mass index (BMI) is a screening tool to identify possible weight problems. It provides an estimate of body fat based on height and weight. Your health care provider can find your BMI and can help you achieve or maintain a healthy weight.For adults 20 years and older:  A BMI below 18.5 is considered underweight.  A BMI of 18.5 to 24.9 is normal.  A BMI of 25 to 29.9 is considered overweight.  A  BMI of 30 and above is considered obese.  Maintain normal blood lipids and cholesterol levels by exercising and minimizing your intake of saturated fat. Eat a balanced diet with plenty of fruit and vegetables. Blood tests for lipids and cholesterol should begin at age 45 and be repeated every 5 years. If your lipid or cholesterol levels are high, you are over 50, or you are at high risk for heart disease, you may need your cholesterol levels checked more frequently.Ongoing high lipid and cholesterol levels should be treated with medicines if diet and exercise are not working.  If you smoke, find out from your health care provider how to quit. If you do not use tobacco, do not start.  Lung cancer screening is recommended for adults aged 45-80 years who are at high risk for developing lung cancer because of a history of smoking. A yearly low-dose CT scan of the lungs is recommended for people who have at least a 30-pack-year history of smoking and are a current smoker or have quit within the past 15 years. A pack year of smoking is smoking an average of 1 pack of cigarettes a day for 1 year (for example: 1 pack a day for 30 years or 2 packs a day for 15 years). Yearly screening should continue until the smoker has stopped smoking for at least 15 years. Yearly screening should be stopped for people who develop a health problem that would prevent them from having lung cancer treatment.  If you are pregnant, do not drink alcohol. If you are  breastfeeding, be very cautious about drinking alcohol. If you are not pregnant and choose to drink alcohol, do not have more than 1 drink per day. One drink is considered to be 12 ounces (355 mL) of beer, 5 ounces (148 mL) of wine, or 1.5 ounces (44 mL) of liquor.  Avoid use of street drugs. Do not share needles with anyone. Ask for help if you need support or instructions about stopping the use of drugs.  High blood pressure causes heart disease and increases the risk  of stroke. Your blood pressure should be checked at least every 1 to 2 years. Ongoing high blood pressure should be treated with medicines if weight loss and exercise do not work.  If you are 55-79 years old, ask your health care provider if you should take aspirin to prevent strokes.  Diabetes screening is done by taking a blood sample to check your blood glucose level after you have not eaten for a certain period of time (fasting). If you are not overweight and you do not have risk factors for diabetes, you should be screened once every 3 years starting at age 45. If you are overweight or obese and you are 40-70 years of age, you should be screened for diabetes every year as part of your cardiovascular risk assessment.  Breast cancer screening is essential preventive care for women. You should practice "breast self-awareness." This means understanding the normal appearance and feel of your breasts and may include breast self-examination. Any changes detected, no matter how small, should be reported to a health care provider. Women in their 20s and 30s should have a clinical breast exam (CBE) by a health care provider as part of a regular health exam every 1 to 3 years. After age 40, women should have a CBE every year. Starting at age 40, women should consider having a mammogram (breast X-ray test) every year. Women who have a family history of breast cancer should talk to their health care provider about genetic screening. Women at a high risk of breast cancer should talk to their health care providers about having an MRI and a mammogram every year.  Breast cancer gene (BRCA)-related cancer risk assessment is recommended for women who have family members with BRCA-related cancers. BRCA-related cancers include breast, ovarian, tubal, and peritoneal cancers. Having family members with these cancers may be associated with an increased risk for harmful changes (mutations) in the breast cancer genes BRCA1 and  BRCA2. Results of the assessment will determine the need for genetic counseling and BRCA1 and BRCA2 testing.  Your health care provider may recommend that you be screened regularly for cancer of the pelvic organs (ovaries, uterus, and vagina). This screening involves a pelvic examination, including checking for microscopic changes to the surface of your cervix (Pap test). You may be encouraged to have this screening done every 3 years, beginning at age 21.  For women ages 30-65, health care providers may recommend pelvic exams and Pap testing every 3 years, or they may recommend the Pap and pelvic exam, combined with testing for human papilloma virus (HPV), every 5 years. Some types of HPV increase your risk of cervical cancer. Testing for HPV may also be done on women of any age with unclear Pap test results.  Other health care providers may not recommend any screening for nonpregnant women who are considered low risk for pelvic cancer and who do not have symptoms. Ask your health care provider if a screening pelvic exam is right for   you.  If you have had past treatment for cervical cancer or a condition that could lead to cancer, you need Pap tests and screening for cancer for at least 20 years after your treatment. If Pap tests have been discontinued, your risk factors (such as having a new sexual partner) need to be reassessed to determine if screening should resume. Some women have medical problems that increase the chance of getting cervical cancer. In these cases, your health care provider may recommend more frequent screening and Pap tests.  Colorectal cancer can be detected and often prevented. Most routine colorectal cancer screening begins at the age of 50 years and continues through age 75 years. However, your health care provider may recommend screening at an earlier age if you have risk factors for colon cancer. On a yearly basis, your health care provider may provide home test kits to check  for hidden blood in the stool. Use of a small camera at the end of a tube, to directly examine the colon (sigmoidoscopy or colonoscopy), can detect the earliest forms of colorectal cancer. Talk to your health care provider about this at age 50, when routine screening begins. Direct exam of the colon should be repeated every 5-10 years through age 75 years, unless early forms of precancerous polyps or small growths are found.  People who are at an increased risk for hepatitis B should be screened for this virus. You are considered at high risk for hepatitis B if:  You were born in a country where hepatitis B occurs often. Talk with your health care provider about which countries are considered high risk.  Your parents were born in a high-risk country and you have not received a shot to protect against hepatitis B (hepatitis B vaccine).  You have HIV or AIDS.  You use needles to inject street drugs.  You live with, or have sex with, someone who has hepatitis B.  You get hemodialysis treatment.  You take certain medicines for conditions like cancer, organ transplantation, and autoimmune conditions.  Hepatitis C blood testing is recommended for all people born from 1945 through 1965 and any individual with known risks for hepatitis C.  Practice safe sex. Use condoms and avoid high-risk sexual practices to reduce the spread of sexually transmitted infections (STIs). STIs include gonorrhea, chlamydia, syphilis, trichomonas, herpes, HPV, and human immunodeficiency virus (HIV). Herpes, HIV, and HPV are viral illnesses that have no cure. They can result in disability, cancer, and death.  You should be screened for sexually transmitted illnesses (STIs) including gonorrhea and chlamydia if:  You are sexually active and are younger than 24 years.  You are older than 24 years and your health care provider tells you that you are at risk for this type of infection.  Your sexual activity has changed  since you were last screened and you are at an increased risk for chlamydia or gonorrhea. Ask your health care provider if you are at risk.  If you are at risk of being infected with HIV, it is recommended that you take a prescription medicine daily to prevent HIV infection. This is called preexposure prophylaxis (PrEP). You are considered at risk if:  You are sexually active and do not regularly use condoms or know the HIV status of your partner(s).  You take drugs by injection.  You are sexually active with a partner who has HIV.  Talk with your health care provider about whether you are at high risk of being infected with HIV. If   you choose to begin PrEP, you should first be tested for HIV. You should then be tested every 3 months for as long as you are taking PrEP.  Osteoporosis is a disease in which the bones lose minerals and strength with aging. This can result in serious bone fractures or breaks. The risk of osteoporosis can be identified using a bone density scan. Women ages 67 years and over and women at risk for fractures or osteoporosis should discuss screening with their health care providers. Ask your health care provider whether you should take a calcium supplement or vitamin D to reduce the rate of osteoporosis.  Menopause can be associated with physical symptoms and risks. Hormone replacement therapy is available to decrease symptoms and risks. You should talk to your health care provider about whether hormone replacement therapy is right for you.  Use sunscreen. Apply sunscreen liberally and repeatedly throughout the day. You should seek shade when your shadow is shorter than you. Protect yourself by wearing long sleeves, pants, a wide-brimmed hat, and sunglasses year round, whenever you are outdoors.  Once a month, do a whole body skin exam, using a mirror to look at the skin on your back. Tell your health care provider of new moles, moles that have irregular borders, moles that  are larger than a pencil eraser, or moles that have changed in shape or color.  Stay current with required vaccines (immunizations).  Influenza vaccine. All adults should be immunized every year.  Tetanus, diphtheria, and acellular pertussis (Td, Tdap) vaccine. Pregnant women should receive 1 dose of Tdap vaccine during each pregnancy. The dose should be obtained regardless of the length of time since the last dose. Immunization is preferred during the 27th-36th week of gestation. An adult who has not previously received Tdap or who does not know her vaccine status should receive 1 dose of Tdap. This initial dose should be followed by tetanus and diphtheria toxoids (Td) booster doses every 10 years. Adults with an unknown or incomplete history of completing a 3-dose immunization series with Td-containing vaccines should begin or complete a primary immunization series including a Tdap dose. Adults should receive a Td booster every 10 years.  Varicella vaccine. An adult without evidence of immunity to varicella should receive 2 doses or a second dose if she has previously received 1 dose. Pregnant females who do not have evidence of immunity should receive the first dose after pregnancy. This first dose should be obtained before leaving the health care facility. The second dose should be obtained 4-8 weeks after the first dose.  Human papillomavirus (HPV) vaccine. Females aged 13-26 years who have not received the vaccine previously should obtain the 3-dose series. The vaccine is not recommended for use in pregnant females. However, pregnancy testing is not needed before receiving a dose. If a female is found to be pregnant after receiving a dose, no treatment is needed. In that case, the remaining doses should be delayed until after the pregnancy. Immunization is recommended for any person with an immunocompromised condition through the age of 61 years if she did not get any or all doses earlier. During the  3-dose series, the second dose should be obtained 4-8 weeks after the first dose. The third dose should be obtained 24 weeks after the first dose and 16 weeks after the second dose.  Zoster vaccine. One dose is recommended for adults aged 30 years or older unless certain conditions are present.  Measles, mumps, and rubella (MMR) vaccine. Adults born  before 1957 generally are considered immune to measles and mumps. Adults born in 1957 or later should have 1 or more doses of MMR vaccine unless there is a contraindication to the vaccine or there is laboratory evidence of immunity to each of the three diseases. A routine second dose of MMR vaccine should be obtained at least 28 days after the first dose for students attending postsecondary schools, health care workers, or international travelers. People who received inactivated measles vaccine or an unknown type of measles vaccine during 1963-1967 should receive 2 doses of MMR vaccine. People who received inactivated mumps vaccine or an unknown type of mumps vaccine before 1979 and are at high risk for mumps infection should consider immunization with 2 doses of MMR vaccine. For females of childbearing age, rubella immunity should be determined. If there is no evidence of immunity, females who are not pregnant should be vaccinated. If there is no evidence of immunity, females who are pregnant should delay immunization until after pregnancy. Unvaccinated health care workers born before 1957 who lack laboratory evidence of measles, mumps, or rubella immunity or laboratory confirmation of disease should consider measles and mumps immunization with 2 doses of MMR vaccine or rubella immunization with 1 dose of MMR vaccine.  Pneumococcal 13-valent conjugate (PCV13) vaccine. When indicated, a person who is uncertain of his immunization history and has no record of immunization should receive the PCV13 vaccine. All adults 65 years of age and older should receive this  vaccine. An adult aged 19 years or older who has certain medical conditions and has not been previously immunized should receive 1 dose of PCV13 vaccine. This PCV13 should be followed with a dose of pneumococcal polysaccharide (PPSV23) vaccine. Adults who are at high risk for pneumococcal disease should obtain the PPSV23 vaccine at least 8 weeks after the dose of PCV13 vaccine. Adults older than 54 years of age who have normal immune system function should obtain the PPSV23 vaccine dose at least 1 year after the dose of PCV13 vaccine.  Pneumococcal polysaccharide (PPSV23) vaccine. When PCV13 is also indicated, PCV13 should be obtained first. All adults aged 65 years and older should be immunized. An adult younger than age 65 years who has certain medical conditions should be immunized. Any person who resides in a nursing home or long-term care facility should be immunized. An adult smoker should be immunized. People with an immunocompromised condition and certain other conditions should receive both PCV13 and PPSV23 vaccines. People with human immunodeficiency virus (HIV) infection should be immunized as soon as possible after diagnosis. Immunization during chemotherapy or radiation therapy should be avoided. Routine use of PPSV23 vaccine is not recommended for American Indians, [place] Natives, or people younger than 65 years unless there are medical conditions that require PPSV23 vaccine. When indicated, people who have unknown immunization and have no record of immunization should receive PPSV23 vaccine. One-time revaccination 5 years after the first dose of PPSV23 is recommended for people aged 19-64 years who have chronic kidney failure, nephrotic syndrome, asplenia, or immunocompromised conditions. People who received 1-2 doses of PPSV23 before age 65 years should receive another dose of PPSV23 vaccine at age 65 years or later if at least 5 years have passed since the previous dose. Doses of PPSV23 are not  needed for people immunized with PPSV23 at or after age 65 years.  Meningococcal vaccine. Adults with asplenia or persistent complement component deficiencies should receive 2 doses of quadrivalent meningococcal conjugate (MenACWY-D) vaccine. The doses should be obtained   at least 2 months apart. Microbiologists working with certain meningococcal bacteria, [place] recruits, people at risk during an outbreak, and people who travel to or live in countries with a high rate of meningitis should be immunized. A first-year college student up through age 34 years who is living in a residence hall should receive a dose if she did not receive a dose on or after her 16th birthday. Adults who have certain high-risk conditions should receive one or more doses of vaccine.  Hepatitis A vaccine. Adults who wish to be protected from this disease, have certain high-risk conditions, work with hepatitis A-infected animals, work in hepatitis A research labs, or travel to or work in countries with a high rate of hepatitis A should be immunized. Adults who were previously unvaccinated and who anticipate close contact with an international adoptee during the first 60 days after arrival in the [place] from a country with a high rate of hepatitis A should be immunized.  Hepatitis B vaccine. Adults who wish to be protected from this disease, have certain high-risk conditions, may be exposed to blood or other infectious body fluids, are household contacts or sex partners of hepatitis B positive people, are clients or workers in certain care facilities, or travel to or work in countries with a high rate of hepatitis B should be immunized.  Haemophilus influenzae type b (Hib) vaccine. A previously unvaccinated person with asplenia or sickle cell disease or having a scheduled splenectomy should receive 1 dose of Hib vaccine. Regardless of previous immunization, a recipient of a hematopoietic stem cell transplant should receive a  3-dose series 6-12 months after her successful transplant. Hib vaccine is not recommended for adults with HIV infection. Preventive Services / Frequency Ages 35 to 4 years  Blood pressure check.** / Every 3-5 years.  Lipid and cholesterol check.** / Every 5 years beginning at age 60.  Clinical breast exam.** / Every 3 years for women in their 71s and 10s.  BRCA-related cancer risk assessment.** / For women who have family members with a BRCA-related cancer (breast, ovarian, tubal, or peritoneal cancers).  Pap test.** / Every 2 years from ages 76 through 26. Every 3 years starting at age 61 through age 76 or 93 with a history of 3 consecutive normal Pap tests.  HPV screening.** / Every 3 years from ages 37 through ages 60 to 51 with a history of 3 consecutive normal Pap tests.  Hepatitis C blood test.** / For any individual with known risks for hepatitis C.  Skin self-exam. / Monthly.  Influenza vaccine. / Every year.  Tetanus, diphtheria, and acellular pertussis (Tdap, Td) vaccine.** / Consult your health care provider. Pregnant women should receive 1 dose of Tdap vaccine during each pregnancy. 1 dose of Td every 10 years.  Varicella vaccine.** / Consult your health care provider. Pregnant females who do not have evidence of immunity should receive the first dose after pregnancy.  HPV vaccine. / 3 doses over 6 months, if 93 and younger. The vaccine is not recommended for use in pregnant females. However, pregnancy testing is not needed before receiving a dose.  Measles, mumps, rubella (MMR) vaccine.** / You need at least 1 dose of MMR if you were born in 1957 or later. You may also need a 2nd dose. For females of childbearing age, rubella immunity should be determined. If there is no evidence of immunity, females who are not pregnant should be vaccinated. If there is no evidence of immunity, females who are  pregnant should delay immunization until after pregnancy.  Pneumococcal  13-valent conjugate (PCV13) vaccine.** / Consult your health care provider.  Pneumococcal polysaccharide (PPSV23) vaccine.** / 1 to 2 doses if you smoke cigarettes or if you have certain conditions.  Meningococcal vaccine.** / 1 dose if you are age 68 to 8 years and a Market researcher living in a residence hall, or have one of several medical conditions, you need to get vaccinated against meningococcal disease. You may also need additional booster doses.  Hepatitis A vaccine.** / Consult your health care provider.  Hepatitis B vaccine.** / Consult your health care provider.  Haemophilus influenzae type b (Hib) vaccine.** / Consult your health care provider. Ages 7 to 53 years  Blood pressure check.** / Every year.  Lipid and cholesterol check.** / Every 5 years beginning at age 25 years.  Lung cancer screening. / Every year if you are aged 11-80 years and have a 30-pack-year history of smoking and currently smoke or have quit within the past 15 years. Yearly screening is stopped once you have quit smoking for at least 15 years or develop a health problem that would prevent you from having lung cancer treatment.  Clinical breast exam.** / Every year after age 48 years.  BRCA-related cancer risk assessment.** / For women who have family members with a BRCA-related cancer (breast, ovarian, tubal, or peritoneal cancers).  Mammogram.** / Every year beginning at age 41 years and continuing for as long as you are in good health. Consult with your health care provider.  Pap test.** / Every 3 years starting at age 65 years through age 37 or 70 years with a history of 3 consecutive normal Pap tests.  HPV screening.** / Every 3 years from ages 72 years through ages 60 to 40 years with a history of 3 consecutive normal Pap tests.  Fecal occult blood test (FOBT) of stool. / Every year beginning at age 21 years and continuing until age 5 years. You may not need to do this test if you get  a colonoscopy every 10 years.  Flexible sigmoidoscopy or colonoscopy.** / Every 5 years for a flexible sigmoidoscopy or every 10 years for a colonoscopy beginning at age 35 years and continuing until age 48 years.  Hepatitis C blood test.** / For all people born from [birth year] through 1965 and any individual with known risks for hepatitis C.  Skin self-exam. / Monthly.  Influenza vaccine. / Every year.  Tetanus, diphtheria, and acellular pertussis (Tdap/Td) vaccine.** / Consult your health care provider. Pregnant women should receive 1 dose of Tdap vaccine during each pregnancy. 1 dose of Td every 10 years.  Varicella vaccine.** / Consult your health care provider. Pregnant females who do not have evidence of immunity should receive the first dose after pregnancy.  Zoster vaccine.** / 1 dose for adults aged 30 years or older.  Measles, mumps, rubella (MMR) vaccine.** / You need at least 1 dose of MMR if you were born in 1957 or later. You may also need a second dose. For females of childbearing age, rubella immunity should be determined. If there is no evidence of immunity, females who are not pregnant should be vaccinated. If there is no evidence of immunity, females who are pregnant should delay immunization until after pregnancy.  Pneumococcal 13-valent conjugate (PCV13) vaccine.** / Consult your health care provider.  Pneumococcal polysaccharide (PPSV23) vaccine.** / 1 to 2 doses if you smoke cigarettes or if you have certain conditions.  Meningococcal vaccine.** /  Consult your health care provider.  Hepatitis A vaccine.** / Consult your health care provider.  Hepatitis B vaccine.** / Consult your health care provider.  Haemophilus influenzae type b (Hib) vaccine.** / Consult your health care provider. Ages 64 years and over  Blood pressure check.** / Every year.  Lipid and cholesterol check.** / Every 5 years beginning at age 23 years.  Lung cancer screening. / Every year if you  are aged 16-80 years and have a 30-pack-year history of smoking and currently smoke or have quit within the past 15 years. Yearly screening is stopped once you have quit smoking for at least 15 years or develop a health problem that would prevent you from having lung cancer treatment.  Clinical breast exam.** / Every year after age 74 years.  BRCA-related cancer risk assessment.** / For women who have family members with a BRCA-related cancer (breast, ovarian, tubal, or peritoneal cancers).  Mammogram.** / Every year beginning at age 44 years and continuing for as long as you are in good health. Consult with your health care provider.  Pap test.** / Every 3 years starting at age 58 years through age 22 or 39 years with 3 consecutive normal Pap tests. Testing can be stopped between 65 and 70 years with 3 consecutive normal Pap tests and no abnormal Pap or HPV tests in the past 10 years.  HPV screening.** / Every 3 years from ages 64 years through ages 70 or 61 years with a history of 3 consecutive normal Pap tests. Testing can be stopped between 65 and 70 years with 3 consecutive normal Pap tests and no abnormal Pap or HPV tests in the past 10 years.  Fecal occult blood test (FOBT) of stool. / Every year beginning at age 40 years and continuing until age 27 years. You may not need to do this test if you get a colonoscopy every 10 years.  Flexible sigmoidoscopy or colonoscopy.** / Every 5 years for a flexible sigmoidoscopy or every 10 years for a colonoscopy beginning at age 7 years and continuing until age 32 years.  Hepatitis C blood test.** / For all people born from [birth year] through 1965 and any individual with known risks for hepatitis C.  Osteoporosis screening.** / A one-time screening for women ages 30 years and over and women at risk for fractures or osteoporosis.  Skin self-exam. / Monthly.  Influenza vaccine. / Every year.  Tetanus, diphtheria, and acellular pertussis (Tdap/Td)  vaccine.** / 1 dose of Td every 10 years.  Varicella vaccine.** / Consult your health care provider.  Zoster vaccine.** / 1 dose for adults aged 35 years or older.  Pneumococcal 13-valent conjugate (PCV13) vaccine.** / Consult your health care provider.  Pneumococcal polysaccharide (PPSV23) vaccine.** / 1 dose for all adults aged 46 years and older.  Meningococcal vaccine.** / Consult your health care provider.  Hepatitis A vaccine.** / Consult your health care provider.  Hepatitis B vaccine.** / Consult your health care provider.  Haemophilus influenzae type b (Hib) vaccine.** / Consult your health care provider. ** Family history and personal history of risk and conditions may change your health care provider's recommendations.   This information is not intended to replace advice given to you by your health care provider. Make sure you discuss any questions you have with your health care provider.   Document Released: 07/25/2001 Document Revised: 06/19/2014 Document Reviewed: 10/24/2010 Elsevier Interactive Patient Education [employer].

## 2015-09-22 ENCOUNTER — Other Ambulatory Visit: Payer: Self-pay | Admitting: Family Medicine

## 2015-09-22 DIAGNOSIS — Z1231 Encounter for screening mammogram for malignant neoplasm of breast: Secondary | ICD-10-CM

## 2015-09-22 LAB — LIPID PANEL
CHOL/HDL RATIO: 5
CHOLESTEROL: 305 mg/dL — AB (ref 0–200)
HDL: 61.5 mg/dL (ref >39.00)
LDL CALC: 221 mg/dL — AB (ref 0–99)
NonHDL: 243.1
TRIGLYCERIDES: 109 mg/dL (ref 0.0–149.0)
VLDL: 21.8 mg/dL (ref 0.0–40.0)

## 2015-09-22 LAB — CBC WITH DIFFERENTIAL/PLATELET
BASOS ABS: 0.1 10*3/uL (ref 0.0–0.1)
Basophils Relative: 1 % (ref 0.0–3.0)
EOS ABS: 0.3 10*3/uL (ref 0.0–0.7)
Eosinophils Relative: 4.1 % (ref 0.0–5.0)
HCT: 39.4 % (ref 36.0–46.0)
Hemoglobin: 12.6 g/dL (ref 12.0–15.0)
LYMPHS ABS: 1.9 10*3/uL (ref 0.7–4.0)
Lymphocytes Relative: 29.9 % (ref 12.0–46.0)
MCHC: 31.9 g/dL (ref 30.0–36.0)
MCV: 71.5 fl — AB (ref 78.0–100.0)
MONO ABS: 0.4 10*3/uL (ref 0.1–1.0)
MONOS PCT: 5.5 % (ref 3.0–12.0)
NEUTROS ABS: 3.8 10*3/uL (ref 1.4–7.7)
NEUTROS PCT: 59.5 % (ref 43.0–77.0)
PLATELETS: 184 10*3/uL (ref 150.0–400.0)
RBC: 5.51 Mil/uL — AB (ref 3.87–5.11)
RDW: 15.3 % (ref 11.5–15.5)
WBC: 6.5 10*3/uL (ref 4.0–10.5)

## 2015-09-22 LAB — HEPATIC FUNCTION PANEL
ALBUMIN: 4.5 g/dL (ref 3.5–5.2)
ALK PHOS: 83 U/L (ref 39–117)
ALT: 13 U/L (ref 0–35)
AST: 20 U/L (ref 0–37)
BILIRUBIN DIRECT: 0.1 mg/dL (ref 0.0–0.3)
BILIRUBIN TOTAL: 0.7 mg/dL (ref 0.2–1.2)
Total Protein: 7.9 g/dL (ref 6.0–8.3)

## 2015-09-22 LAB — BASIC METABOLIC PANEL
BUN: 12 mg/dL (ref 6–23)
CALCIUM: 10 mg/dL (ref 8.4–10.5)
CO2: 33 mEq/L — ABNORMAL HIGH (ref 19–32)
Chloride: 102 mEq/L (ref 96–112)
Creatinine, Ser: 0.95 mg/dL (ref 0.40–1.20)
GFR: 79.01 mL/min (ref >60.00)
GLUCOSE: 86 mg/dL (ref 70–99)
POTASSIUM: 4.1 meq/L (ref 3.5–5.1)
Sodium: 142 mEq/L (ref 135–145)

## 2015-09-22 LAB — HIV ANTIBODY (ROUTINE TESTING W REFLEX): HIV: NONREACTIVE

## 2015-09-22 LAB — HEPATITIS C ANTIBODY: HCV AB: NEGATIVE

## 2015-09-22 LAB — TSH: TSH: 1.34 u[IU]/mL (ref 0.35–4.50)

## 2015-09-23 LAB — CYTOLOGY - PAP

## 2015-09-28 ENCOUNTER — Telehealth: Payer: Self-pay | Admitting: Family Medicine

## 2015-09-28 NOTE — Telephone Encounter (Signed)
Your Total cholesterol and the LDL (bad cholesterol) is elevated. The LDL goal is < 100,  HDL >40,  TG < 150. Although some fats are healthy, you need to limit the saturated and trans fats you eat. Saturated fats, like those in meat, butter, cheese and other full-fat dairy products, and some oils, raise your total cholesterol. Trans fats, often used in margarines and store-bought cookies, crackers and cakes, are particularly bad for your cholesterol levels. Trans fats raise LDL cholesterol, and lower high-density lipoprotein (HDL), the "good" cholesterol. I recommend that you watch your diet and exercise which will increase the HDL and decrease the LDL and TG. Try taking over the counter Fish, Fish Oil, or Flaxseed oil, this will also help increase the HDL and decrease Triglycerides and we can recheck your labs in 3- 6 months.   Patient aware and verbalized understanding. She has agreed to trying flax seed oil.   KP

## 2015-09-28 NOTE — Telephone Encounter (Signed)
Caller name: Self  Can be reached: [phone]   Reason for call: Request call back with lab results from 4/11

## 2015-10-07 ENCOUNTER — Encounter: Payer: Self-pay | Admitting: Medical

## 2015-10-07 ENCOUNTER — Ambulatory Visit (HOSPITAL_BASED_OUTPATIENT_CLINIC_OR_DEPARTMENT_OTHER)
Admission: RE | Admit: 2015-10-07 | Discharge: 2015-10-07 | Disposition: A | Discharge status: Home / Self Care | Payer: BLUE CROSS/BLUE SHIELD | Source: Ambulatory Visit | Attending: Medical | Admitting: Medical

## 2015-10-07 ENCOUNTER — Ambulatory Visit (INDEPENDENT_AMBULATORY_CARE_PROVIDER_SITE_OTHER): Payer: BLUE CROSS/BLUE SHIELD | Admitting: Medical

## 2015-10-07 VITALS — BP 118/86 | HR 89 | Temp 98.0°F | Ht 65.0 in | Wt 173.6 lb

## 2015-10-07 DIAGNOSIS — M7989 Other specified soft tissue disorders: Secondary | ICD-10-CM | POA: Diagnosis not present

## 2015-10-07 DIAGNOSIS — M25572 Pain in left ankle and joints of left foot: Secondary | ICD-10-CM | POA: Insufficient documentation

## 2015-10-07 MED ORDER — DICLOFENAC SODIUM 75 MG PO TBEC: 75.0000 mg | DELAYED_RELEASE_TABLET | Freq: Two times a day (BID) | ORAL | Status: DC

## 2015-10-07 NOTE — Progress Notes (Signed)
   Subjective:    Patient ID: Stacy Bond, female    DOB: 22-Apr-1961, 55 y.o.   MRN: [id number]  HPI  Pt in states she about 12 days ago she was standing and she stepped on a rock and twisted her  ankle and felt severe pain. At time of the injury. She did not fall. But had to sit due to pain. Since then pain on walking.  Review of Systems  Constitutional: Negative for fever, chills and fatigue.  Respiratory: Negative for cough, chest tightness, shortness of breath and wheezing.   Cardiovascular: Negative for chest pain and palpitations.  Musculoskeletal:       Rt ankle and foot pain.  Skin: Negative for rash.  Hematological: Negative for adenopathy. Does not bruise/bleed easily.    Past Medical History  Diagnosis Date  . Fibroid   . Anemia   . Hemorrhoids      Social History   Social History  . Marital Status: Married    Spouse Name: N/A  . Number of Children: 1  . Years of Education: N/A   Occupational History  . Not on file.   Social History Main Topics  . Smoking status: Never Smoker   . Smokeless tobacco: Never Used  . Alcohol Use: 1.2 oz/week    2 Glasses of wine per week  . Drug Use: No  . Sexual Activity:    Partners: Male   Other Topics Concern  . Not on file   Social History Narrative    No past surgical history on file.  Family History  Problem Relation Age of Onset  . Diabetes Father   . Hypertension Mother   . Pulmonary embolism Mother   . Clotting disorder Daughter     No Known Allergies  Current Outpatient Prescriptions on File Prior to Visit  Medication Sig Dispense Refill  . triamcinolone cream (KENALOG) 0.1 % apply cream topically twice daily 90 g 1   No current facility-administered medications on file prior to visit.    BP 118/86 mmHg  Pulse 89  Temp(Src) 98 F (36.7 C) (Oral)  Ht 5\' 5"  (1.651 m)  Wt 173 lb 9.6 oz (78.744 kg)  BMI 28.89 kg/m2  SpO2 99%       Objective:   Physical Exam  General-no acute distress.    Lt ankle- medial and lateral malleolus pain. Swollen. Lt foot- mild tender to palpation over metatarsal. Mild heel pain. Lower ext- no calf swelling. Negative homans signs.     Assessment & Plan:  For ankle and foot pain get xray of both today stat.  Rest, ice , compress, and elevate.  Gave ace bandage today.  Diclofenac rx given.  Follow up in 7 days or as needed  If fx seen then refer to specialist. If no fx and not improving gradually may still refer to evaluate ligaments.  Stacy Bond, Percell Miller, PA-C

## 2015-10-07 NOTE — Progress Notes (Signed)
Pre visit review using our clinic review tool, if applicable. No additional management support is needed unless otherwise documented below in the visit note. 

## 2015-10-07 NOTE — Patient Instructions (Signed)
For ankle and foot pain get xray of both today stat.  Rest, ice , compress, and elevate.  Gave ace bandage today.  Diclofenac rx given.  Follow up in 7 days or as needed  If fx seen then refer to specialist. If no fx and not improving gradually may still refer to evaluate ligaments.

## 2015-10-08 ENCOUNTER — Telehealth: Payer: Self-pay

## 2015-10-08 NOTE — Telephone Encounter (Signed)
Patient calling to get the results of the xray of ankle [phone]

## 2015-10-08 NOTE — Telephone Encounter (Signed)
Spoke with pt and advised her that the x-ray results have not come back yet. I advised her that I would call her as soon as I got the results.

## 2015-10-11 NOTE — Telephone Encounter (Signed)
Provider notified the pt of the results.

## 2015-10-14 ENCOUNTER — Encounter: Payer: Self-pay | Admitting: Family Medicine

## 2015-10-14 ENCOUNTER — Ambulatory Visit (INDEPENDENT_AMBULATORY_CARE_PROVIDER_SITE_OTHER): Payer: BLUE CROSS/BLUE SHIELD | Admitting: Family Medicine

## 2015-10-14 VITALS — BP 130/90 | HR 94 | Temp 98.6°F | Ht 65.0 in | Wt 172.0 lb

## 2015-10-14 DIAGNOSIS — K625 Hemorrhage of anus and rectum: Secondary | ICD-10-CM | POA: Diagnosis not present

## 2015-10-14 NOTE — Progress Notes (Signed)
Stacy Bond at [facility] [address], [place], [place] [address] [phone]  Date:  10/14/2015   Name:  Stacy Bond   DOB:  1960-09-10   MRN:  [id number]  PCP:  Ann Held, DO    Chief Complaint: Hemorrhoids   History of Present Illness:  Stacy Bond is a 55 y.o. very pleasant female patient who presents with the following:  Here today with concern of rectal bleeding.  She notes a history of constipation and she will do enemas on a regular basis.  She likes doing the enemas as they make her feel more comfortable- has relied on them for years.  She recently added cod liver oil/ flax seed oil to her diet in an attempt to improve her cholesterol, and she increased her fiber intake.  This has helped a lot with her constipation. She is now able to go to the bathroom on her own. She may have 1-2 BM a day. This is good, but she is having to "strain and push" a lot more than she did when she was just using enemas all the time.  She has noted some bleeding with BM over the last few days. She will see a small amount of blood in the toilet bowel and on the tissue.  She may notice some soreness after BM.  She did have bleeding in the past, on and off.  She did have a colonoscopy about 4 years ago. This was done for screening purposes and was negative  She reports a negative FOBT at recent exam with Dr. Etter Sjogren  Patient Active Problem List   Diagnosis Date Noted  . Chest pain 04/15/2013  . Chronic serous otitis media 03/20/2011  . Anemia 03/16/2011  . Seasonal allergies 03/16/2011    Past Medical History  Diagnosis Date  . Fibroid   . Anemia   . Hemorrhoids     No past surgical history on file.  Social History  Substance Use Topics  . Smoking status: Never Smoker   . Smokeless tobacco: Never Used  . Alcohol Use: 1.2 oz/week    2 Glasses of wine per week    Family History  Problem Relation Age of Onset  . Diabetes Father    . Hypertension Mother   . Pulmonary embolism Mother   . Clotting disorder Daughter     No Known Allergies  Medication list has been reviewed and updated.  Current Outpatient Prescriptions on File Prior to Visit  Medication Sig Dispense Refill  . triamcinolone cream (KENALOG) 0.1 % apply cream topically twice daily 90 g 1  . diclofenac (VOLTAREN) 75 MG EC tablet Take 1 tablet (75 mg total) by mouth 2 (two) times daily. (Patient not taking: Reported on 10/14/2015) 30 tablet 0   No current facility-administered medications on file prior to visit.    Review of Systems:  As per HPI- otherwise negative.   Physical Examination: Filed Vitals:   10/14/15 1117  BP: 130/90  Pulse: 94  Temp: 98.6 F (37 C)   Filed Vitals:   10/14/15 1117  Height: 5\' 5"  (1.651 m)  Weight: 172 lb (78.019 kg)   Body mass index is 28.62 kg/(m^2). Ideal Body Weight: Weight in (lb) to have BMI = 25: 149.9  GEN: WDWN, NAD, Non-toxic, A & O x 3, looks well HEENT: Atraumatic, Normocephalic. Neck supple. No masses, No LAD. Ears and Nose: No external deformity. CV: RRR, No M/G/R. No JVD.  No thrill. No extra heart sounds. PULM: CTA B, no wheezes, crackles, rhonchi. No retractions. No resp. distress. No accessory muscle use. ABD: S, NT, ND, +BS. No rebound. No HSM. Rectal exam: external hemorrhoids, slightly irritated and sore to exam.  No gross blood on exam EXTR: No c/c/e NEURO Normal gait.  PSYCH: Normally interactive. Conversant. Not depressed or anxious appearing.  Calm demeanor.    Assessment and Plan: Rectal bleeding  Here today with recent scant rectal bleeding. This is likely due to change in bowel habits as she is now passing stools on her own instead of using enemas. She did have recent colon cancer screening.  Will have her use stool softener.    Called on 5/8 to check on her- no further bleeding.  She had her colonoscopy in 02/2012 by Dr. Fuller Plan. Will touch base with him about any follow-up  that he would recommend   Signed Lamar Blinks, MD

## 2015-10-14 NOTE — Patient Instructions (Signed)
I suspect that your bleeding is caused by trauma from bowel movement.  Continue your dietary changes- it is great that you are able to go on your own! Add a stool softener- docusate sodium 1 or 2 capsules a day should be enough.  Also try some Tucks pads in the anal area for discomfort.   If the bleeding persists more than another few days let me know If any severe bleeding seek help

## 2015-10-14 NOTE — Progress Notes (Signed)
Pre visit review using our clinic tool,if applicable. No additional management support is needed unless otherwise documented below in the visit note.  

## 2015-10-18 ENCOUNTER — Encounter: Payer: Self-pay | Admitting: Family Medicine

## 2015-10-19 ENCOUNTER — Telehealth: Payer: Self-pay | Admitting: Family Medicine

## 2015-10-19 NOTE — Telephone Encounter (Signed)
Caller name: Karliyah Relationship to patient: self Can be reached: [phone]   Reason for call: Pt calling to f/u on information regarding colonoscopy. She states Dr. Lorelei Pont was going to call her after reviewing for futher eval.

## 2015-10-20 NOTE — Telephone Encounter (Signed)
Called pt and sent her a letter

## 2015-11-04 ENCOUNTER — Ambulatory Visit
Admission: RE | Admit: 2015-11-04 | Discharge: 2015-11-04 | Disposition: A | Discharge status: Home / Self Care | Payer: BLUE CROSS/BLUE SHIELD | Source: Ambulatory Visit | Attending: Family Medicine | Admitting: Family Medicine

## 2015-11-04 DIAGNOSIS — Z1231 Encounter for screening mammogram for malignant neoplasm of breast: Secondary | ICD-10-CM

## 2016-03-17 ENCOUNTER — Other Ambulatory Visit: Payer: Self-pay | Admitting: Family Medicine

## 2016-03-17 DIAGNOSIS — L309 Dermatitis, unspecified: Secondary | ICD-10-CM

## 2016-03-17 MED ORDER — TRIAMCINOLONE ACETONIDE 0.1 % EX CREA: TOPICAL_CREAM | CUTANEOUS | 1 refills | Status: DC

## 2016-03-17 NOTE — Telephone Encounter (Signed)
Medication has been sent. PC

## 2016-03-17 NOTE — Telephone Encounter (Signed)
Patient is requesting a refill of triamcinolone cream (KENALOG) 0.1 %  Patient Relation: Self Patient phone: [phone] Pharmacy: [facility] ([facility] [place] [facility]

## 2016-07-06 ENCOUNTER — Emergency Department (HOSPITAL_BASED_OUTPATIENT_CLINIC_OR_DEPARTMENT_OTHER): Payer: Self-pay

## 2016-07-06 ENCOUNTER — Emergency Department (HOSPITAL_BASED_OUTPATIENT_CLINIC_OR_DEPARTMENT_OTHER)
Admission: EM | Admit: 2016-07-06 | Discharge: 2016-07-06 | Disposition: A | Discharge status: Home / Self Care | Payer: Self-pay | Attending: Emergency Medicine | Admitting: Emergency Medicine

## 2016-07-06 ENCOUNTER — Encounter (HOSPITAL_BASED_OUTPATIENT_CLINIC_OR_DEPARTMENT_OTHER): Payer: Self-pay | Admitting: *Deleted

## 2016-07-06 DIAGNOSIS — R072 Precordial pain: Secondary | ICD-10-CM | POA: Insufficient documentation

## 2016-07-06 DIAGNOSIS — R079 Chest pain, unspecified: Secondary | ICD-10-CM

## 2016-07-06 DIAGNOSIS — R0602 Shortness of breath: Secondary | ICD-10-CM | POA: Insufficient documentation

## 2016-07-06 LAB — COMPREHENSIVE METABOLIC PANEL
ALBUMIN: 4.3 g/dL (ref 3.5–5.0)
ALK PHOS: 85 U/L (ref 38–126)
ALT: 14 U/L (ref 14–54)
ANION GAP: 7 (ref 5–15)
AST: 21 U/L (ref 15–41)
BILIRUBIN TOTAL: 0.8 mg/dL (ref 0.3–1.2)
BUN: 10 mg/dL (ref 6–20)
CALCIUM: 9.7 mg/dL (ref 8.9–10.3)
CO2: 28 mmol/L (ref 22–32)
CREATININE: 1.03 mg/dL — AB (ref 0.44–1.00)
Chloride: 103 mmol/L (ref 101–111)
GFR calc non Af Amer: >60 mL/min (ref >60)
GLUCOSE: 92 mg/dL (ref 65–99)
Potassium: 4.1 mmol/L (ref 3.5–5.1)
SODIUM: 138 mmol/L (ref 135–145)
TOTAL PROTEIN: 8 g/dL (ref 6.5–8.1)

## 2016-07-06 LAB — CBC WITH DIFFERENTIAL/PLATELET
BASOS PCT: 0 %
Basophils Absolute: 0 10*3/uL (ref 0.0–0.1)
Eosinophils Absolute: 0.3 10*3/uL (ref 0.0–0.7)
Eosinophils Relative: 4 %
HEMATOCRIT: 38.9 % (ref 36.0–46.0)
HEMOGLOBIN: 12.8 g/dL (ref 12.0–15.0)
LYMPHS PCT: 29 %
Lymphs Abs: 2.1 10*3/uL (ref 0.7–4.0)
MCH: 23.3 pg — ABNORMAL LOW (ref 26.0–34.0)
MCHC: 32.9 g/dL (ref 30.0–36.0)
MCV: 70.9 fL — ABNORMAL LOW (ref 78.0–100.0)
MONOS PCT: 7 %
Monocytes Absolute: 0.5 10*3/uL (ref 0.1–1.0)
NEUTROS ABS: 4.3 10*3/uL (ref 1.7–7.7)
NEUTROS PCT: 60 %
Platelets: 197 10*3/uL (ref 150–400)
RBC: 5.49 MIL/uL — ABNORMAL HIGH (ref 3.87–5.11)
RDW: 14.9 % (ref 11.5–15.5)
WBC: 7.2 10*3/uL (ref 4.0–10.5)

## 2016-07-06 LAB — TROPONIN I: Troponin I: <0.03 ng/mL (ref <0.03)

## 2016-07-06 MED ORDER — FAMOTIDINE 20 MG PO TABS: 20.0000 mg | ORAL_TABLET | Freq: Two times a day (BID) | ORAL | 0 refills | Status: DC

## 2016-07-06 NOTE — ED Provider Notes (Signed)
, [facility] Provider Note   CSN: [id number] Arrival date & time: 07/06/16  1440     History   Chief Complaint Chief Complaint  Patient presents with  . Chest Pain    HPI Sundy Maidonado is a 56 y.o. female.  Patient with onset of substernal chest discomfort constant for 2 weeks intermittently its worse. Not made worse or better by anything. Nonradiating. Associated with some slight shortness of breath slight dizziness no nausea no vomiting  Patient without any cardiac risk factors. No family history. Patient had a stress test 2 years ago which was normal. Patient states that the discomfort did increase some not really considered true pain at noon time today.      Past Medical History:  Diagnosis Date  . Anemia   . Fibroid   . Hemorrhoids     Patient Active Problem List   Diagnosis Date Noted  . Chest pain 04/15/2013  . Chronic serous otitis media 03/20/2011  . Anemia 03/16/2011  . Seasonal allergies 03/16/2011    History reviewed. No pertinent surgical history.  OB History    No data available       Home Medications    Prior to Admission medications   Medication Sig Start Date End Date Taking? Authorizing Provider  diclofenac (VOLTAREN) 75 MG EC tablet Take 1 tablet (75 mg total) by mouth 2 (two) times daily. Patient not taking: Reported on 10/14/2015 10/07/15   Mackie Pai, PA-C  famotidine (PEPCID) 20 MG tablet Take 1 tablet (20 mg total) by mouth 2 (two) times daily. 07/06/16   Fredia Sorrow, MD  triamcinolone cream (KENALOG) 0.1 % apply cream topically twice daily 03/17/16   Ann Held, DO    Family History Family History  Problem Relation Age of Onset  . Diabetes Father   . Hypertension Mother   . Pulmonary embolism Mother   . Clotting disorder Daughter     Social History Social History  Substance Use Topics  . Smoking status: Never Smoker  . Smokeless tobacco: Never Used  . Alcohol use 1.2 oz/week    2 Glasses of  wine per week     Allergies   Patient has no known allergies.   Review of Systems Review of Systems  Constitutional: Negative for fever.  HENT: Negative for congestion.   Eyes: Negative for visual disturbance.  Respiratory: Positive for shortness of breath.   Cardiovascular: Positive for chest pain.  Gastrointestinal: Negative for abdominal pain, nausea and vomiting.  Genitourinary: Negative for dysuria.  Musculoskeletal: Negative for back pain.  Skin: Negative for rash.  Neurological: Positive for dizziness.  Hematological: Does not bruise/bleed easily.  Psychiatric/Behavioral: Negative for confusion.     Physical Exam Updated Vital Signs BP 135/100   Pulse 67   Temp 97.7 F (36.5 C) (Oral)   Resp 14   Ht 5\' 5"  (1.651 m)   Wt 79.8 kg   SpO2 100%   BMI 29.29 kg/m   Physical Exam  Constitutional: She is oriented to person, place, and time. She appears well-developed and well-nourished. No distress.  HENT:  Head: Normocephalic and atraumatic.  Mouth/Throat: Oropharynx is clear and moist.  Eyes: EOM are normal. Pupils are equal, round, and reactive to light.  Neck: Normal range of motion. Neck supple.  Cardiovascular: Normal rate, regular rhythm and normal heart sounds.   Pulmonary/Chest: Effort normal and breath sounds normal. No respiratory distress.  Abdominal: Soft. Bowel sounds are normal. There is no tenderness.  Musculoskeletal: Normal range of motion.  Neurological: She is alert and oriented to person, place, and time. No cranial nerve deficit or sensory deficit. She exhibits normal muscle tone. Coordination normal.  Skin: Skin is warm. Capillary refill takes less than 2 seconds.  Nursing note and vitals reviewed.    ED Treatments / Results  Labs (all labs ordered are listed, but only abnormal results are displayed) Labs Reviewed  CBC WITH DIFFERENTIAL/PLATELET - Abnormal; Notable for the following:       Result Value   RBC 5.49 (*)    MCV 70.9 (*)      MCH 23.3 (*)    All other components within normal limits  COMPREHENSIVE METABOLIC PANEL - Abnormal; Notable for the following:    Creatinine, Ser 1.03 (*)    All other components within normal limits  TROPONIN I   Results for orders placed or performed during the hospital encounter of 07/06/16  CBC with Differential  Result Value Ref Range   WBC 7.2 4.0 - 10.5 K/uL   RBC 5.49 (H) 3.87 - 5.11 MIL/uL   Hemoglobin 12.8 12.0 - 15.0 g/dL   HCT 38.9 36.0 - 46.0 %   MCV 70.9 (L) 78.0 - 100.0 fL   MCH 23.3 (L) 26.0 - 34.0 pg   MCHC 32.9 30.0 - 36.0 g/dL   RDW 14.9 11.5 - 15.5 %   Platelets 197 150 - 400 K/uL   Neutrophils Relative % 60 %   Lymphocytes Relative 29 %   Monocytes Relative 7 %   Eosinophils Relative 4 %   Basophils Relative 0 %   Neutro Abs 4.3 1.7 - 7.7 K/uL   Lymphs Abs 2.1 0.7 - 4.0 K/uL   Monocytes Absolute 0.5 0.1 - 1.0 K/uL   Eosinophils Absolute 0.3 0.0 - 0.7 K/uL   Basophils Absolute 0.0 0.0 - 0.1 K/uL   RBC Morphology ELLIPTOCYTES   Comprehensive metabolic panel  Result Value Ref Range   Sodium 138 135 - 145 mmol/L   Potassium 4.1 3.5 - 5.1 mmol/L   Chloride 103 101 - 111 mmol/L   CO2 28 22 - 32 mmol/L   Glucose, Bld 92 65 - 99 mg/dL   BUN 10 6 - 20 mg/dL   Creatinine, Ser 1.03 (H) 0.44 - 1.00 mg/dL   Calcium 9.7 8.9 - 10.3 mg/dL   Total Protein 8.0 6.5 - 8.1 g/dL   Albumin 4.3 3.5 - 5.0 g/dL   AST 21 15 - 41 U/L   ALT 14 14 - 54 U/L   Alkaline Phosphatase 85 38 - 126 U/L   Total Bilirubin 0.8 0.3 - 1.2 mg/dL   GFR calc non Af Amer >60 >60 mL/min   GFR calc Af Amer >60 >60 mL/min   Anion gap 7 5 - 15  Troponin I  Result Value Ref Range   Troponin I <0.03 <0.03 ng/mL    EKG  EKG Interpretation  Date/Time:  Thursday July 06 2016 14:48:15 EST Ventricular Rate:  79 PR Interval:  174 QRS Duration: 74 QT Interval:  390 QTC Calculation: 447 R Axis:   15 Text Interpretation:  Normal sinus rhythm Possible Left atrial enlargement Cannot rule  out Anterior infarct , age undetermined Abnormal ECG No significant change since last tracing Confirmed by Canary Brim  MD, MARTHA [phone]) on 07/06/2016 2:53:18 PM       Radiology Dg Chest 2 View  Result Date: 07/06/2016 CLINICAL DATA:  Worsening chest pain and shortness of breath for the past  2 weeks. EXAM: CHEST  2 VIEW COMPARISON:  None in PACs FINDINGS: The lungs are adequately inflated and clear. The heart and pulmonary vascularity are normal. The mediastinum is normal in width. There is no pleural effusion. The bony thorax exhibits no acute abnormality. IMPRESSION: There is no active cardiopulmonary disease. Electronically Signed   By: David  Martinique M.D.   On: 07/06/2016 16:15    Procedures Procedures (including critical care time)  Medications Ordered in ED Medications - No data to display   Initial Impression / Assessment and Plan / ED Course  I have reviewed the triage vital signs and the nursing notes.  Pertinent labs & imaging results that were available during my care of the patient were reviewed by me and considered in my medical decision making (see chart for details).     Patient with a complaint of substernal chest discomfort that is been constant for the past 2 weeks maybe a little bit longer. Nonradiating. Occasional with nausea and occasional slight shortness of breath. Occasionally with some dizziness. No nausea no vomiting. Patient had a stress test 2 years ago which was negative.  Patient states that around noon time today the discomfort intensified slightly. Never considered to be severe pain. Patient does take a baby aspirin a day.  Workup shows no acute findings troponin is normal chest x-ray negative EKG without any significant changes. Will give patient a trial of Pepcid and have her follow-up with cardiology. Patient will return for any new or worse symptoms.  Not tachycardic oxygen saturation on room air 100%. No leg swelling. No concern for pulmonary  embolus.  Final Clinical Impressions(s) / ED Diagnoses   Final diagnoses:  Chest pain, unspecified type    New Prescriptions New Prescriptions   FAMOTIDINE (PEPCID) 20 MG TABLET    Take 1 tablet (20 mg total) by mouth 2 (two) times daily.     Fredia Sorrow, MD 07/06/16 1731

## 2016-07-06 NOTE — Discharge Instructions (Signed)
Make appointment to follow-up with cardiology. Trial of Pepcid for the next 2 weeks. Return for any new or worse symptoms. Continue baby aspirin a day. Today's workup without any significant findings.

## 2016-07-06 NOTE — ED Triage Notes (Signed)
Chest pain x 2 weeks. Feels like indigestion unrelieved with tums. At times she gets dizzy when she has the pain.

## 2017-02-22 ENCOUNTER — Ambulatory Visit (INDEPENDENT_AMBULATORY_CARE_PROVIDER_SITE_OTHER): Payer: Commercial Managed Care - PPO | Admitting: Family Medicine

## 2017-02-22 ENCOUNTER — Ambulatory Visit (HOSPITAL_BASED_OUTPATIENT_CLINIC_OR_DEPARTMENT_OTHER)
Admission: RE | Admit: 2017-02-22 | Discharge: 2017-02-22 | Disposition: A | Discharge status: Home / Self Care | Payer: Commercial Managed Care - PPO | Source: Ambulatory Visit | Attending: Family Medicine | Admitting: Family Medicine

## 2017-02-22 ENCOUNTER — Encounter: Payer: Self-pay | Admitting: Family Medicine

## 2017-02-22 ENCOUNTER — Telehealth: Payer: Self-pay | Admitting: Family Medicine

## 2017-02-22 ENCOUNTER — Other Ambulatory Visit: Payer: Self-pay | Admitting: Family Medicine

## 2017-02-22 VITALS — BP 110/80 | HR 84 | Temp 98.3°F | Ht 65.0 in | Wt 177.6 lb

## 2017-02-22 DIAGNOSIS — Z0000 Encounter for general adult medical examination without abnormal findings: Secondary | ICD-10-CM | POA: Diagnosis not present

## 2017-02-22 DIAGNOSIS — E785 Hyperlipidemia, unspecified: Secondary | ICD-10-CM

## 2017-02-22 DIAGNOSIS — Z1231 Encounter for screening mammogram for malignant neoplasm of breast: Secondary | ICD-10-CM

## 2017-02-22 DIAGNOSIS — D259 Leiomyoma of uterus, unspecified: Secondary | ICD-10-CM | POA: Diagnosis not present

## 2017-02-22 DIAGNOSIS — L309 Dermatitis, unspecified: Secondary | ICD-10-CM | POA: Diagnosis not present

## 2017-02-22 LAB — POC URINALSYSI DIPSTICK (AUTOMATED)
Bilirubin, UA: NEGATIVE
Blood, UA: NEGATIVE
Glucose, UA: NEGATIVE
KETONES UA: NEGATIVE
LEUKOCYTES UA: NEGATIVE
Nitrite, UA: NEGATIVE
PROTEIN UA: NEGATIVE
SPEC GRAV UA: 1.015 (ref 1.010–1.025)
Urobilinogen, UA: 0.2 E.U./dL
pH, UA: 6 (ref 5.0–8.0)

## 2017-02-22 LAB — COMPREHENSIVE METABOLIC PANEL
ALK PHOS: 88 U/L (ref 39–117)
ALT: 16 U/L (ref 0–35)
AST: 19 U/L (ref 0–37)
Albumin: 4.5 g/dL (ref 3.5–5.2)
BILIRUBIN TOTAL: 0.5 mg/dL (ref 0.2–1.2)
BUN: 11 mg/dL (ref 6–23)
CALCIUM: 10.2 mg/dL (ref 8.4–10.5)
CHLORIDE: 102 meq/L (ref 96–112)
CO2: 34 mEq/L — ABNORMAL HIGH (ref 19–32)
CREATININE: 0.9 mg/dL (ref 0.40–1.20)
GFR: 83.65 mL/min (ref >60.00)
Glucose, Bld: 81 mg/dL (ref 70–99)
Potassium: 4.5 mEq/L (ref 3.5–5.1)
SODIUM: 141 meq/L (ref 135–145)
TOTAL PROTEIN: 7.8 g/dL (ref 6.0–8.3)

## 2017-02-22 LAB — CBC WITH DIFFERENTIAL/PLATELET
Basophils Absolute: 0.1 10*3/uL (ref 0.0–0.1)
Basophils Relative: 1 % (ref 0.0–3.0)
EOS ABS: 0.3 10*3/uL (ref 0.0–0.7)
Eosinophils Relative: 4.6 % (ref 0.0–5.0)
HEMATOCRIT: 40.8 % (ref 36.0–46.0)
HEMOGLOBIN: 12.7 g/dL (ref 12.0–15.0)
LYMPHS PCT: 33.7 % (ref 12.0–46.0)
Lymphs Abs: 2.1 10*3/uL (ref 0.7–4.0)
MCHC: 31.1 g/dL (ref 30.0–36.0)
MCV: 74.5 fl — ABNORMAL LOW (ref 78.0–100.0)
MONO ABS: 0.4 10*3/uL (ref 0.1–1.0)
Monocytes Relative: 7 % (ref 3.0–12.0)
Neutro Abs: 3.4 10*3/uL (ref 1.4–7.7)
Neutrophils Relative %: 53.7 % (ref 43.0–77.0)
Platelets: 179 10*3/uL (ref 150.0–400.0)
RBC: 5.48 Mil/uL — AB (ref 3.87–5.11)
RDW: 14.8 % (ref 11.5–15.5)
WBC: 6.2 10*3/uL (ref 4.0–10.5)

## 2017-02-22 LAB — LIPID PANEL
Cholesterol: 313 mg/dL — ABNORMAL HIGH (ref 0–200)
HDL: 69.9 mg/dL (ref >39.00)
NONHDL: 242.95
Total CHOL/HDL Ratio: 4
Triglycerides: 220 mg/dL — ABNORMAL HIGH (ref 0.0–149.0)
VLDL: 44 mg/dL — ABNORMAL HIGH (ref 0.0–40.0)

## 2017-02-22 LAB — TSH: TSH: 1.39 u[IU]/mL (ref 0.35–4.50)

## 2017-02-22 LAB — LDL CHOLESTEROL, DIRECT: Direct LDL: 192 mg/dL

## 2017-02-22 MED ORDER — TRIAMCINOLONE ACETONIDE 0.1 % EX CREA: TOPICAL_CREAM | CUTANEOUS | 3 refills | Status: DC

## 2017-02-22 MED FILL — TRIAMCINOLONE 0.1% CREAM: 0.1 | 30 days supply | Qty: 80 | Fill #0

## 2017-02-22 NOTE — Progress Notes (Signed)
Subjective:     Stacy Bond is a 56 y.o. female and is here for a comprehensive physical exam. The patient reports no problems.  Social History   Social History  . Marital status: Married    Spouse name: N/A  . Number of children: 1  . Years of education: N/A   Occupational History  . Not on file.   Social History Main Topics  . Smoking status: Never Smoker  . Smokeless tobacco: Never Used  . Alcohol use 1.2 oz/week    2 Glasses of wine per week  . Drug use: No  . Sexual activity: Yes    Partners: Male   Other Topics Concern  . Not on file   Social History Narrative  . No narrative on file   Health Maintenance  Topic Date Due  . INFLUENZA VACCINE  09/09/2017 (Originally 01/10/2017)  . MAMMOGRAM  11/03/2017  . PAP SMEAR  09/21/2018  . COLONOSCOPY  02/26/2022  . TETANUS/TDAP  08/09/2024  . Hepatitis C Screening  Completed  . HIV Screening  Completed    The following portions of the patient's history were reviewed and updated as appropriate:  She  has a past medical history of Anemia; Fibroid; and Hemorrhoids. She  does not have any pertinent problems on file. She  has no past surgical history on file. Her family history includes Clotting disorder in her daughter; Diabetes in her father; Hypertension in her mother; Pulmonary embolism in her mother. She  reports that she has never smoked. She has never used smokeless tobacco. She reports that she drinks about 1.2 oz of alcohol per week . She reports that she does not use drugs. She has a current medication list which includes the following prescription(s): diclofenac, famotidine, and triamcinolone cream. Current Outpatient Prescriptions on File Prior to Visit  Medication Sig Dispense Refill  . diclofenac (VOLTAREN) 75 MG EC tablet Take 1 tablet (75 mg total) by mouth 2 (two) times daily. (Patient not taking: Reported on 10/14/2015) 30 tablet 0  . famotidine (PEPCID) 20 MG tablet Take 1 tablet (20 mg total) by mouth 2 (two)  times daily. (Patient not taking: Reported on 02/22/2017) 30 tablet 0  . triamcinolone cream (KENALOG) 0.1 % apply cream topically twice daily (Patient not taking: Reported on 02/22/2017) 90 g 1   No current facility-administered medications on file prior to visit.    She has No Known Allergies..  Review of Systems Review of Systems  Constitutional: Negative for activity change, appetite change and fatigue.  HENT: Negative for hearing loss, congestion, tinnitus and ear discharge.  dentist q19m Eyes: Negative for visual disturbance (see optho q1y -- vision corrected to 20/20 with glasses).  Respiratory: Negative for cough, chest tightness and shortness of breath.   Cardiovascular: Negative for chest pain, palpitations and leg swelling.  Gastrointestinal: Negative for abdominal pain, diarrhea, constipation and abdominal distention.  Genitourinary: Negative for urgency, frequency, decreased urine volume and difficulty urinating.  Musculoskeletal: Negative for back pain, arthralgias and gait problem.  Skin: Negative for color change, pallor and rash.  Neurological: Negative for dizziness, light-headedness, numbness and headaches.  Hematological: Negative for adenopathy. Does not bruise/bleed easily.  Psychiatric/Behavioral: Negative for suicidal ideas, confusion, sleep disturbance, self-injury, dysphoric mood, decreased concentration and agitation.       Objective:    BP 110/80 (BP Location: Right Arm, Patient Position: Sitting, Cuff Size: Normal)   Pulse 84   Temp 98.3 F (36.8 C) (Oral)   Ht 5\' 5"  (1.651 m)  Wt 177 lb 9.6 oz (80.6 kg)   SpO2 98%   BMI 29.55 kg/m  General appearance: alert, cooperative, appears stated age and no distress Head: Normocephalic, without obvious abnormality, atraumatic Eyes: conjunctivae/corneas clear. PERRL, EOM's intact. Fundi benign. Ears: normal TM's and external ear canals both ears Nose: Nares normal. Septum midline. Mucosa normal. No drainage or  sinus tenderness. Throat: lips, mucosa, and tongue normal; teeth and gums normal Neck: no adenopathy, no carotid bruit, no JVD, supple, symmetrical, trachea midline and thyroid not enlarged, symmetric, no tenderness/mass/nodules Back: symmetric, no curvature. ROM normal. No CVA tenderness. Lungs: clear to auscultation bilaterally Breasts: normal appearance, no masses or tenderness Heart: regular rate and rhythm, S1, S2 normal, no murmur, click, rub or gallop Abdomen: abnormal findings:  mass, located in the lower abdomen Pelvic: deferred Extremities: extremities normal, atraumatic, no cyanosis or edema Pulses: 2+ and symmetric Skin: Skin color, texture, turgor normal. No rashes or lesions Lymph nodes: Cervical, supraclavicular, and axillary nodes normal. Neurologic: Alert and oriented X 3, normal strength and tone. Normal symmetric reflexes. Normal coordination and gait    Assessment:    Healthy female exam.      Plan:    ghm utd Check labs See After Visit Summary for Counseling Recommendations    1. Eczema, unspecified type  - triamcinolone cream (KENALOG) 0.1 %; apply cream topically twice daily  Dispense: 90 g; Refill: 3  2. Preventative health care ghm utd - TSH - Lipid panel - CBC with Differential/Platelet - Comprehensive metabolic panel - POCT Urinalysis Dipstick (Automated)  3. Hyperlipidemia LDL goal <100 Tolerating statin, encouraged heart healthy diet, avoid trans fats, minimize simple carbs and saturated fats. Increase exercise as tolerated - Lipid panel - Comprehensive metabolic panel  4. Uterine leiomyoma, unspecified location Pt has had for years -- she was told it was the size of a grapefruit several years ago - Ambulatory referral to Obstetrics / Gynecology

## 2017-02-22 NOTE — Addendum Note (Signed)
Addended by: Harl Bowie on: 02/22/2017 04:26 PM   Modules accepted: Orders

## 2017-02-22 NOTE — Patient Instructions (Signed)
Preventive Care 40-64 Years, Female Preventive care refers to lifestyle choices and visits with your health care provider that can promote health and wellness. What does preventive care include?  A yearly physical exam. This is also called an annual well check.  Dental exams once or twice a year.  Routine eye exams. Ask your health care provider how often you should have your eyes checked.  Personal lifestyle choices, including: ? Daily care of your teeth and gums. ? Regular physical activity. ? Eating a healthy diet. ? Avoiding tobacco and drug use. ? Limiting alcohol use. ? Practicing safe sex. ? Taking low-dose aspirin daily starting at age 55. ? Taking vitamin and mineral supplements as recommended by your health care provider. What happens during an annual well check? The services and screenings done by your health care provider during your annual well check will depend on your age, overall health, lifestyle risk factors, and family history of disease. Counseling Your health care provider may ask you questions about your:  Alcohol use.  Tobacco use.  Drug use.  Emotional well-being.  Home and relationship well-being.  Sexual activity.  Eating habits.  Work and work Statistician.  Method of birth control.  Menstrual cycle.  Pregnancy history.  Screening You may have the following tests or measurements:  Height, weight, and BMI.  Blood pressure.  Lipid and cholesterol levels. These may be checked every 5 years, or more frequently if you are over 55 years old.  Skin check.  Lung cancer screening. You may have this screening every year starting at age 55 if you have a 30-pack-year history of smoking and currently smoke or have quit within the past 15 years.  Fecal occult blood test (FOBT) of the stool. You may have this test every year starting at age 55.  Flexible sigmoidoscopy or colonoscopy. You may have a sigmoidoscopy every 5 years or a colonoscopy  every 10 years starting at age 55.  Hepatitis C blood test.  Hepatitis B blood test.  Sexually transmitted disease (STD) testing.  Diabetes screening. This is done by checking your blood sugar (glucose) after you have not eaten for a while (fasting). You may have this done every 1-3 years.  Mammogram. This may be done every 1-2 years. Talk to your health care provider about when you should start having regular mammograms. This may depend on whether you have a family history of breast cancer.  BRCA-related cancer screening. This may be done if you have a family history of breast, ovarian, tubal, or peritoneal cancers.  Pelvic exam and Pap test. This may be done every 3 years starting at age 55. Starting at age 55, this may be done every 5 years if you have a Pap test in combination with an HPV test.  Bone density scan. This is done to screen for osteoporosis. You may have this scan if you are at high risk for osteoporosis.  Discuss your test results, treatment options, and if necessary, the need for more tests with your health care provider. Vaccines Your health care provider may recommend certain vaccines, such as:  Influenza vaccine. This is recommended every year.  Tetanus, diphtheria, and acellular pertussis (Tdap, Td) vaccine. You may need a Td booster every 10 years.  Varicella vaccine. You may need this if you have not been vaccinated.  Zoster vaccine. You may need this after age 5.  Measles, mumps, and rubella (MMR) vaccine. You may need at least one dose of MMR if you were born in  1956 or later. You may also need a second dose.  Pneumococcal 13-valent conjugate (PCV13) vaccine. You may need this if you have certain conditions and were not previously vaccinated.  Pneumococcal polysaccharide (PPSV23) vaccine. You may need one or two doses if you smoke cigarettes or if you have certain conditions.  Meningococcal vaccine. You may need this if you have certain  conditions.  Hepatitis A vaccine. You may need this if you have certain conditions or if you travel or work in places where you may be exposed to hepatitis A.  Hepatitis B vaccine. You may need this if you have certain conditions or if you travel or work in places where you may be exposed to hepatitis B.  Haemophilus influenzae type b (Hib) vaccine. You may need this if you have certain conditions.  Talk to your health care provider about which screenings and vaccines you need and how often you need them. This information is not intended to replace advice given to you by your health care provider. Make sure you discuss any questions you have with your health care provider. Document Released: 06/25/2015 Document Revised: 02/16/2016 Document Reviewed: 03/30/2015 Elsevier Interactive Patient Education  2017 [employer].

## 2017-02-22 NOTE — Telephone Encounter (Signed)
Pt called in to check on her lab results.

## 2017-02-23 NOTE — Telephone Encounter (Signed)
See labs 

## 2017-02-26 ENCOUNTER — Other Ambulatory Visit: Payer: Self-pay | Admitting: Family Medicine

## 2017-02-26 DIAGNOSIS — E785 Hyperlipidemia, unspecified: Secondary | ICD-10-CM

## 2017-02-26 MED ORDER — ATORVASTATIN CALCIUM 20 MG PO TABS: 20.0000 mg | ORAL_TABLET | Freq: Every day | ORAL | 3 refills | Status: DC

## 2017-02-26 NOTE — Telephone Encounter (Signed)
I do not see any lab notes written from you about what they mean

## 2017-02-26 NOTE — Telephone Encounter (Signed)
Pt called wanting to know her lab results ASAP. Please call pt at [phone]. Please advise since pt already had called last wk and still no respond.

## 2017-02-26 NOTE — Telephone Encounter (Signed)
Cholesterol--- LDL goal < 100,  HDL >40,  TG < 150.  Diet and exercise will increase HDL and decrease LDL and TG.  Fish,  Fish Oil, Flaxseed oil will also help increase the HDL and decrease Triglycerides.   Recheck labs in 3 months The rest were normal  She really needs to be on cholesterol meds lipitor 20 mg #30  1 po qhs, 2 refills  Lipid, cmp Apologize to pt-- her labs were not sent to my in basket for some reason.

## 2017-02-26 NOTE — Telephone Encounter (Signed)
Called the patient informed of results/instructions. Sent in lipitor to her local pharmacy Scheduled lab appt in 3 months/put in order. She verbalized understanding/agreed to all instructions. She had no further questions or concerns.

## 2017-02-28 ENCOUNTER — Encounter: Payer: Self-pay | Admitting: Medical

## 2017-02-28 ENCOUNTER — Ambulatory Visit (INDEPENDENT_AMBULATORY_CARE_PROVIDER_SITE_OTHER): Payer: Commercial Managed Care - PPO | Admitting: Medical

## 2017-02-28 VITALS — BP 140/90 | HR 80 | Temp 98.3°F | Resp 16 | Ht 65.0 in | Wt 176.6 lb

## 2017-02-28 DIAGNOSIS — R030 Elevated blood-pressure reading, without diagnosis of hypertension: Secondary | ICD-10-CM | POA: Diagnosis not present

## 2017-02-28 DIAGNOSIS — R51 Headache: Secondary | ICD-10-CM | POA: Diagnosis not present

## 2017-02-28 DIAGNOSIS — R519 Headache, unspecified: Secondary | ICD-10-CM

## 2017-02-28 MED ORDER — CYCLOBENZAPRINE HCL 5 MG PO TABS
5.0000 mg | ORAL_TABLET | Freq: Every day | ORAL | 0 refills | Status: DC
Start: 2017-02-28 — End: 2017-08-31

## 2017-02-28 MED ORDER — KETOROLAC TROMETHAMINE 30 MG/ML IJ SOLN
30.0000 mg | Freq: Once | INTRAMUSCULAR | Status: AC
  Administered 2017-02-28: 30 mg via INTRAMUSCULAR

## 2017-02-28 NOTE — Patient Instructions (Addendum)
For your recent headaches and headache today, we gave you Toradol 30 mg IM. Also later tonight take 1 Flexeril tablet before you go to sleep.  Please give me update later tonight or tomorrow morning if the headache stopped with treatment.(by my chart or call tomorrow am)  We need to follow your headache response to treatment. If you have no response by tomorrow morning then will likely go ahead and try to schedule CT of your head without contrast.  If your headache worsens or  if you have any motor or sensory symptoms as discussed today, then would advise emergency department evaluation for imaging.  You response to above treatment will help [place] determine other medications that might be helpful.  Also want you to start checking your blood pressure daily and document the readings. You have a history of some elevated blood pressure readings before so I am not entirely convinced that current blood pressure is causing your headache. However if your blood pressure does remain elevated then would recommend blood pressure medication. Try to start low-salt diet and get some daily exercise.  Follow-up date will be determined after you update me on how treatment today helped your headache.

## 2017-02-28 NOTE — Progress Notes (Signed)
Subjective:    Patient ID: Stacy Bond, female    DOB: 04-Jul-1960, 56 y.o.   MRN: [id number]  HPI  Pt in for one week of ha. This ha is coming and going. She states pain location is frontal and back of her head. Pt states will get relief from ha for about 30 minutes to one hour then ha will come back. She has been mild light sensitive. Pt in past will get occasional ha in past that would respond to advil. But this time only decreases headache only for one hour and then ha comes back. Pt states earlier today had 7/10 level ha. Now ha is about 5/10. NO vision changes. No slurred speech. She feels little unsteady at times but no dizzy. No gross motor or sensory or motor deficits. No neck stiffness. No fevers.  New medication lipitor but ha before lipitor started.   Pt does not report any severe stress recently.   Pt notes in past one time with tooth infection she had ha. With antibiotic she felt better. No recent tooth pain.  Pt denies any recent congestion. No sinus pressure. But report at onset of ha felt brief under the weather. No body aches.   Review of Systems  Constitutional: Negative for chills, fatigue and fever.  HENT: Negative for congestion, drooling, ear discharge, ear pain, facial swelling, hearing loss, nosebleeds, postnasal drip, sinus pain, sinus pressure, sore throat, tinnitus and voice change.   Eyes: Positive for photophobia. Negative for pain, discharge, redness and visual disturbance.       Mild photophobia  Cardiovascular: Negative for chest pain and palpitations.  Gastrointestinal: Negative for abdominal pain, constipation, diarrhea, nausea and vomiting.  Musculoskeletal: Negative for back pain and myalgias.  Skin: Negative for rash.  Neurological: Positive for headaches. Negative for dizziness, tremors, seizures, speech difficulty, weakness and numbness.  Hematological: Negative for adenopathy. Does not bruise/bleed easily.  Psychiatric/Behavioral: Negative for  behavioral problems, confusion, dysphoric mood and hallucinations. The patient is not nervous/anxious.    Past Medical History:  Diagnosis Date  . Anemia   . Fibroid   . Hemorrhoids      Social History   Social History  . Marital status: Married    Spouse name: N/A  . Number of children: 1  . Years of education: N/A   Occupational History  . Not on file.   Social History Main Topics  . Smoking status: Never Smoker  . Smokeless tobacco: Never Used  . Alcohol use 1.2 oz/week    2 Glasses of wine per week  . Drug use: No  . Sexual activity: Yes    Partners: Male   Other Topics Concern  . Not on file   Social History Narrative  . No narrative on file    No past surgical history on file.  Family History  Problem Relation Age of Onset  . Diabetes Father   . Hypertension Mother   . Pulmonary embolism Mother   . Clotting disorder Daughter     No Known Allergies  Current Outpatient Prescriptions on File Prior to Visit  Medication Sig Dispense Refill  . atorvastatin (LIPITOR) 20 MG tablet Take 1 tablet (20 mg total) by mouth daily. 30 tablet 3  . diclofenac (VOLTAREN) 75 MG EC tablet Take 1 tablet (75 mg total) by mouth 2 (two) times daily. 30 tablet 0  . famotidine (PEPCID) 20 MG tablet Take 1 tablet (20 mg total) by mouth 2 (two) times daily. 30 tablet 0  .  triamcinolone cream (KENALOG) 0.1 % apply cream topically twice daily 90 g 3   No current facility-administered medications on file prior to visit.     BP 140/90   Pulse 80   Temp 98.3 F (36.8 C) (Oral)   Resp 16   Ht 5\' 5"  (1.651 m)   Wt 176 lb 9.6 oz (80.1 kg)   SpO2 100%   BMI 29.39 kg/m       Objective:   Physical Exam  General Mental Status- Alert. General Appearance- Not in acute distress.   Skin General: Color- Normal Color. Moisture- Normal Moisture.  Neck Carotid Arteries- Normal color. Moisture- Normal Moisture. No carotid bruits. No JVD. No neck stiffness/no rigidity. Good range  of motion  Chest and Lung Exam Auscultation: Breath Sounds:-Normal.  Cardiovascular Auscultation:Rythm- Regular. Murmurs & Other Heart Sounds:Auscultation of the heart reveals- No Murmurs.  Abdomen Inspection:-Inspeection Normal. Palpation/Percussion:Note:No mass. Palpation and Percussion of the abdomen reveal- Non Tender, Non Distended + BS, no rebound or guarding.   Neurologic Cranial Nerve exam:- CN III-XII intact(No nystagmus), symmetric smile. Drift Test:- No drift. Romberg Exam:- Negative.  Heal to Toe Gait exam:-Normal. Finger to Nose:- Normal/Intact Strength:- 5/5 equal and symmetric strength both upper and lower extremities.   HEENT Head- Normal. Ear Auditory Canal - Left- Normal. Right - Normal.Tympanic Membrane- Left- Normal. Right- Normal. Eye Sclera/Conjunctiva- Left- Normal. Right- Normal. Nose & Sinuses Nasal Mucosa- Left-   Not Boggy and Congested. Right-  Not  Boggy and  Congested.Bilateral no maxillary but faint frontal region pain on palpation.. Mouth & Throat Lips: Upper Lip- Normal: no dryness, cracking, pallor, cyanosis, or vesicular eruption. Lower Lip-Normal: no dryness, cracking, pallor, cyanosis or vesicular eruption. Buccal Mucosa- Bilateral- No Aphthous ulcers. Oropharynx- No Discharge or Erythema. Tonsils: Characteristics- Bilateral- No Erythema or Congestion. Size/Enlargement- Bilateral- No enlargement. Discharge- bilateral-None.            Assessment & Plan:  For your recent headaches and headache today, we gave you Toradol 30 mg IM. Also later tonight take 1 Flexeril tablet before you go to sleep.  Please give me update later tonight or tomorrow morning if the headache stopped with treatment.(by my chart or call tomorrow am)  We need to follow your headache response to treatment. If you have no response by tomorrow morning then will likely go ahead and try to schedule CT of your head without contrast.  If your headache worsens or  if  you have any motor or sensory symptoms as discussed today, then would advise emergency department evaluation for imaging.  You response to above treatment will help [place] determine other medications that might be helpful.  Also want you to start checking your blood pressure daily and document the readings. You have a history of some elevated blood pressure readings before so I am not entirely convinced that current blood pressure is causing your headache. However if your blood pressure does remain elevated then would recommend blood pressure medication. Try to start low-salt diet and get some daily exercise.  Follow-up date will be determined after you update me on how treatment today helped your headache.  Anavey Coombes, Percell Miller, PA-C

## 2017-03-01 ENCOUNTER — Encounter: Payer: Self-pay | Admitting: Medical

## 2017-03-02 ENCOUNTER — Telehealth: Payer: Self-pay | Admitting: Medical

## 2017-03-02 DIAGNOSIS — G4489 Other headache syndrome: Secondary | ICD-10-CM

## 2017-03-02 NOTE — Telephone Encounter (Signed)
I placed CT head without contrast order. Please see if you can get that done on Monday.

## 2017-03-06 ENCOUNTER — Telehealth: Payer: Self-pay | Admitting: Medical

## 2017-03-06 ENCOUNTER — Ambulatory Visit (HOSPITAL_BASED_OUTPATIENT_CLINIC_OR_DEPARTMENT_OTHER)
Admission: RE | Admit: 2017-03-06 | Discharge: 2017-03-06 | Disposition: A | Discharge status: Home / Self Care | Payer: Commercial Managed Care - PPO | Source: Ambulatory Visit | Attending: Medical | Admitting: Medical

## 2017-03-06 DIAGNOSIS — J0120 Acute ethmoidal sinusitis, unspecified: Secondary | ICD-10-CM | POA: Diagnosis not present

## 2017-03-06 DIAGNOSIS — G4489 Other headache syndrome: Secondary | ICD-10-CM

## 2017-03-06 DIAGNOSIS — E236 Other disorders of pituitary gland: Secondary | ICD-10-CM | POA: Diagnosis not present

## 2017-03-06 MED ORDER — AMOXICILLIN-POT CLAVULANATE 875-125 MG PO TABS: 1.0000 | ORAL_TABLET | Freq: Two times a day (BID) | ORAL | 0 refills | Status: DC

## 2017-03-06 NOTE — Telephone Encounter (Signed)
Prescription of Augmentin sent to patient's pharmacy. 

## 2017-03-06 NOTE — Telephone Encounter (Signed)
Please look at patient result note on the CT scan. There was an epic glitch in something about result note could not be released at the time the evaluated the CT and sent you a note. So just wanted to make sure he got that.

## 2017-03-07 ENCOUNTER — Telehealth: Payer: Self-pay | Admitting: Medical

## 2017-03-07 NOTE — Telephone Encounter (Signed)
I tried to call patient but I could not leave a message because her voicemail was not set up. Also it looks like I can't send a my chart message either which is a little odd because earlier it look like my chart was available for her? Let her know that she had no findings suggest mass bleeding or extra fluid seen. There was an area were mucosa in the ethmoid sinus region looked thickened. So I want to give her an antibiotic/Augmentin to see if this might help decrease her recent headaches. But also there was a finding on the CT were the radiologist mentioned there is significant degree of empty sella. This is a little complicated to explain and let me know if patient expresses she does understand or is confused. The sella is area where the pituitary sits. This area looks a little bit and 50/flattened. Sounds like the radiologist is not really 100% sure but on CT it has the appearance of this. I think she might need confirmation from MRI? Or if her headaches are still persisting it will probably be best to refer her to a neurologist. Tried to explain this to her but let her know also I can call her to explain this as well.  This is a note I sent. There was an epic collection I don't think this went to her by chart. Please call advise patient.

## 2017-03-07 NOTE — Telephone Encounter (Signed)
Please look at CT imaging result note. I sent this or try to send note on Tuesday evening. I ran across her chart last night and it looks like nobody has called her.

## 2017-03-08 ENCOUNTER — Telehealth: Payer: Self-pay | Admitting: Medical

## 2017-03-08 NOTE — Telephone Encounter (Signed)
Would you send patient a letter explaining that we have finding said I need to discuss with her. I just tried to call her and there was no answer. I think I've tried to call maybe 3 times. Another option is if you could continue to try to call her and then get me when you get her on the line. Ask me how busy I am before you to call her because if I am real busy I might not be able to clean everything in detail.

## 2017-03-08 NOTE — Telephone Encounter (Signed)
I tried to send patient to my chart message asking her to call our office so I can discuss CT of the head results with patient.

## 2017-03-08 NOTE — Telephone Encounter (Signed)
ES-I don't feel comfortable with this information and am fearful that I will explain it incorrectly/I don't understand the findings/thx dmf

## 2017-03-08 NOTE — Telephone Encounter (Signed)
Please see telephone note dated today, 03/08/17. Thanks.

## 2017-03-08 NOTE — Telephone Encounter (Signed)
ES-I have mailed her a letter/I also advised in the letter that you have tried to contact her and she has no VM to please call the office/I will let you know if she happens to call back/thx dmf

## 2017-03-14 ENCOUNTER — Encounter: Payer: Self-pay | Admitting: Family Medicine

## 2017-03-14 ENCOUNTER — Encounter: Payer: Self-pay | Admitting: Medical

## 2017-03-14 ENCOUNTER — Ambulatory Visit (INDEPENDENT_AMBULATORY_CARE_PROVIDER_SITE_OTHER): Payer: Commercial Managed Care - PPO | Admitting: Family Medicine

## 2017-03-14 ENCOUNTER — Telehealth: Payer: Self-pay | Admitting: Medical

## 2017-03-14 VITALS — BP 133/97 | HR 93 | Ht 65.0 in | Wt 172.0 lb

## 2017-03-14 DIAGNOSIS — D259 Leiomyoma of uterus, unspecified: Secondary | ICD-10-CM

## 2017-03-14 DIAGNOSIS — R519 Headache, unspecified: Secondary | ICD-10-CM

## 2017-03-14 DIAGNOSIS — D219 Benign neoplasm of connective and other soft tissue, unspecified: Secondary | ICD-10-CM | POA: Insufficient documentation

## 2017-03-14 DIAGNOSIS — R51 Headache: Principal | ICD-10-CM

## 2017-03-14 NOTE — Telephone Encounter (Signed)
I talked with patient today and went over CT findings. She is okay with me referring her to neurologist. I explained the sella findings. Explained to her we are not sure of the significance of this but will get neurologist opinion.  Since I last saw her she states that she is eating better and that her blood pressure has come down by about 10 points. Headache seemed to resolve with drop in blood pressure. She still has a little bit of ethmoid sinus region pain and I explained the CT findings in this area. I called in Augmentin antibiotic and she will start that.  In the event that her headaches return asked her to notify me. If that were to be the case and I was asked that neurology expedite her appointment if possible.

## 2017-03-14 NOTE — Patient Instructions (Signed)
Uterine Fibroids Uterine fibroids are tissue masses (tumors) that can develop in the womb (uterus). They are also called leiomyomas. This type of tumor is not cancerous (benign) and does not spread to other parts of the body outside of the pelvic area, which is between the hip bones. Occasionally, fibroids may develop in the fallopian tubes, in the cervix, or on the support structures (ligaments) that surround the uterus. You can have one or many fibroids. Fibroids can vary in size, weight, and where they grow in the uterus. Some can become quite large. Most fibroids do not require medical treatment. What are the causes? A fibroid can develop when a single uterine cell keeps growing (replicating). Most cells in the human body have a control mechanism that keeps them from replicating without control. What are the signs or symptoms? Symptoms may include:  Heavy bleeding during your period.  Bleeding or spotting between periods.  Pelvic pain and pressure.  Bladder problems, such as needing to urinate more often (urinary frequency) or urgently.  Inability to reproduce offspring (infertility).  Miscarriages.  How is this diagnosed? Uterine fibroids are diagnosed through a physical exam. Your health care provider may feel the lumpy tumors during a pelvic exam. Ultrasonography and an MRI may be done to determine the size, location, and number of fibroids. How is this treated? Treatment may include:  Watchful waiting. This involves getting the fibroid checked by your health care provider to see if it grows or shrinks. Follow your health care provider's recommendations for how often to have this checked.  Hormone medicines. These can be taken by mouth or given through an intrauterine device (IUD).  Surgery. ? Removing the fibroids (myomectomy) or the uterus (hysterectomy). ? Removing blood supply to the fibroids (uterine artery embolization).  If fibroids interfere with your fertility and you  want to become pregnant, your health care provider may recommend having the fibroids removed. Follow these instructions at home:  Keep all follow-up visits as directed by your health care provider. This is important.  Take over-the-counter and prescription medicines only as told by your health care provider. ? If you were prescribed a hormone treatment, take the hormone medicines exactly as directed.  Ask your health care provider about taking iron pills and increasing the amount of dark green, leafy vegetables in your diet. These actions can help to boost your blood iron levels, which may be affected by heavy menstrual bleeding.  Pay close attention to your period and tell your health care provider about any changes, such as: ? Increased blood flow that requires you to use more pads or tampons than usual per month. ? A change in the number of days that your period lasts per month. ? A change in symptoms that are associated with your period, such as abdominal cramping or back pain. Contact a health care provider if:  You have pelvic pain, back pain, or abdominal cramps that cannot be controlled with medicines.  You have an increase in bleeding between and during periods.  You soak tampons or pads in a half hour or less.  You feel lightheaded, extra tired, or weak. Get help right away if:  You faint.  You have a sudden increase in pelvic pain. This information is not intended to replace advice given to you by your health care provider. Make sure you discuss any questions you have with your health care provider. Document Released: 05/26/2000 Document Revised: 01/27/2016 Document Reviewed: 11/25/2013 Elsevier Interactive Patient Education  2018 [employer]  

## 2017-03-14 NOTE — Telephone Encounter (Signed)
Noted/thx dmf 

## 2017-03-14 NOTE — Progress Notes (Signed)
Patient complaining of fibroids. Kathrene Alu RNBSN

## 2017-03-14 NOTE — Progress Notes (Signed)
   Subjective:    Patient ID: Stacy Bond is a 56 y.o. female presenting with No chief complaint on file.  on 03/14/2017  HPI: Has a long h/o fibroids for quite a while. Prior to menopause had heavy cycles. No bleeding x 8 years. No further bleeding.Marland Kitchenat her last visit with her PCP noted to have enlarged uterus and to have it checked. Notes no pain or bleeding. Does not know how large they were before.  Review of Systems  Constitutional: Negative for chills and fever.  Respiratory: Negative for shortness of breath.   Cardiovascular: Negative for chest pain.  Gastrointestinal: Negative for abdominal pain, nausea and vomiting.  Genitourinary: Negative for dysuria.  Skin: Negative for rash.      Objective:    BP (!) 133/97 (BP Location: Left Arm, Patient Position: Sitting)   Pulse 93   Ht 5\' 5"  (1.651 m)   Wt 172 lb (78 kg)   BMI 28.62 kg/m  Physical Exam  Constitutional: She is oriented to person, place, and time. She appears well-developed and well-nourished. No distress.  HENT:  Head: Normocephalic and atraumatic.  Eyes: No scleral icterus.  Neck: Neck supple.  Cardiovascular: Normal rate.   Pulmonary/Chest: Effort normal.  Abdominal: Soft.  Genitourinary:  Genitourinary Comments: Uterus is 18-20 wk size and firm.   Neurological: She is alert and oriented to person, place, and time.  Skin: Skin is warm and dry.  Psychiatric: She has a normal mood and affect.        Assessment & Plan:   Problem List Items Addressed This Visit      Unprioritized   Fibroid - Primary    18-20 wk size--and not bothering her, but she would like this out. Interested in minimally invasive approach--but traveling to the [place] for 4 wks in November. Will check u/s and ensure no concerns then f/u with [facility] to see about robotic hyst option. Please include me in her surgery if possible. Risks of surgery reviewed and include but are not limited to bleeding, infection, injury to surrounding structures,  including bowel, bladder and ureters, blood clots, and death.         Relevant Orders   US PELVIS (TRANSABDOMINAL ONLY)      Total face-to-face time with patient: 30 minutes. Over 50% of encounter was spent on counseling and coordination of care. Return in about 4 weeks (around 04/11/2017) for see [facility] for discussion about robotic hyst.  Donnamae Jude 03/14/2017 3:31 PM

## 2017-03-14 NOTE — Assessment & Plan Note (Addendum)
18-20 wk size--and not bothering her, but she would like this out. Interested in minimally invasive approach--but traveling to the [place] for 4 wks in November. Will check u/s and ensure no concerns then f/u with [facility] to see about robotic hyst option. Please include me in her surgery if possible. Risks of surgery reviewed and include but are not limited to bleeding, infection, injury to surrounding structures, including bowel, bladder and ureters, blood clots, and death.

## 2017-03-20 ENCOUNTER — Other Ambulatory Visit: Payer: Self-pay

## 2017-03-20 DIAGNOSIS — D219 Benign neoplasm of connective and other soft tissue, unspecified: Secondary | ICD-10-CM

## 2017-03-20 NOTE — Progress Notes (Signed)
[facility] would like a transvaginal order added to her ultrasound coming up. Added per Dr. Kennon Rounds. Kathrene Alu RNBSN

## 2017-03-22 ENCOUNTER — Ambulatory Visit (HOSPITAL_BASED_OUTPATIENT_CLINIC_OR_DEPARTMENT_OTHER): Payer: Commercial Managed Care - PPO

## 2017-04-09 ENCOUNTER — Ambulatory Visit: Payer: Commercial Managed Care - PPO | Admitting: Obstetrics & Gynecology

## 2017-04-09 DIAGNOSIS — Z01419 Encounter for gynecological examination (general) (routine) without abnormal findings: Secondary | ICD-10-CM

## 2017-05-30 ENCOUNTER — Other Ambulatory Visit: Payer: Commercial Managed Care - PPO

## 2017-08-31 ENCOUNTER — Ambulatory Visit (INDEPENDENT_AMBULATORY_CARE_PROVIDER_SITE_OTHER): Payer: Self-pay | Admitting: Family Medicine

## 2017-08-31 ENCOUNTER — Encounter: Payer: Self-pay | Admitting: Family Medicine

## 2017-08-31 VITALS — BP 132/80 | HR 77 | Temp 98.3°F | Resp 16 | Ht 65.0 in | Wt 171.2 lb

## 2017-08-31 DIAGNOSIS — E785 Hyperlipidemia, unspecified: Secondary | ICD-10-CM

## 2017-08-31 DIAGNOSIS — K5901 Slow transit constipation: Secondary | ICD-10-CM

## 2017-08-31 LAB — COMPREHENSIVE METABOLIC PANEL
ALK PHOS: 101 U/L (ref 39–117)
ALT: 19 U/L (ref 0–35)
AST: 19 U/L (ref 0–37)
Albumin: 4.6 g/dL (ref 3.5–5.2)
BILIRUBIN TOTAL: 0.8 mg/dL (ref 0.2–1.2)
BUN: 11 mg/dL (ref 6–23)
CALCIUM: 10.3 mg/dL (ref 8.4–10.5)
CO2: 31 meq/L (ref 19–32)
CREATININE: 0.85 mg/dL (ref 0.40–1.20)
Chloride: 103 mEq/L (ref 96–112)
GFR: 89.18 mL/min (ref >60.00)
GLUCOSE: 93 mg/dL (ref 70–99)
Potassium: 4.3 mEq/L (ref 3.5–5.1)
Sodium: 141 mEq/L (ref 135–145)
Total Protein: 8.3 g/dL (ref 6.0–8.3)

## 2017-08-31 LAB — LIPID PANEL
CHOL/HDL RATIO: 4
Cholesterol: 267 mg/dL — ABNORMAL HIGH (ref 0–200)
HDL: 61.3 mg/dL (ref >39.00)
LDL Cholesterol: 188 mg/dL — ABNORMAL HIGH (ref 0–99)
NonHDL: 205.72
TRIGLYCERIDES: 89 mg/dL (ref 0.0–149.0)
VLDL: 17.8 mg/dL (ref 0.0–40.0)

## 2017-08-31 NOTE — Progress Notes (Signed)
Subjective:  I acted as a Education administrator for [employer]. Stacy Bond, [name]   Patient ID: Stacy Bond, female    DOB: 07/01/1960, 57 y.o.   MRN: [id number]  Chief Complaint  Patient presents with  . Hyperlipidemia    HPI  Patient is in today for follow up on hyperlipidemia.  Her husband lost his job  so they have no ins at this time but she does want her labs done.  She is not taking any meds.   She is also c/o constipation she is using laxat Patient Care Team: Carollee Herter, Alferd Apa, DO as PCP - General (Family Medicine)   Past Medical History:  Diagnosis Date  . Anemia   . Fibroid   . Hemorrhoids     No past surgical history on file.  Family History  Problem Relation Age of Onset  . Diabetes Father   . Hypertension Mother   . Pulmonary embolism Mother   . Clotting disorder Daughter     Social History   Socioeconomic History  . Marital status: Married    Spouse name: Not on file  . Number of children: 1  . Years of education: Not on file  . Highest education level: Not on file  Occupational History  . Not on file  Social Needs  . Financial resource strain: Not on file  . Food insecurity:    Worry: Not on file    Inability: Not on file  . Transportation needs:    Medical: Not on file    Non-medical: Not on file  Tobacco Use  . Smoking status: Never Smoker  . Smokeless tobacco: Never Used  Substance and Sexual Activity  . Alcohol use: Yes    Alcohol/week: 1.2 oz    Types: 2 Glasses of wine per week  . Drug use: No  . Sexual activity: Yes    Partners: Male  Lifestyle  . Physical activity:    Days per week: Not on file    Minutes per session: Not on file  . Stress: Not on file  Relationships  . Social connections:    Talks on phone: Not on file    Gets together: Not on file    Attends religious service: Not on file    Active member of club or organization: Not on file    Attends meetings of clubs or organizations: Not on file    Relationship status: Not  on file  . Intimate partner violence:    Fear of current or ex partner: Not on file    Emotionally abused: Not on file    Physically abused: Not on file    Forced sexual activity: Not on file  Other Topics Concern  . Not on file  Social History Narrative  . Not on file    Outpatient Medications Prior to Visit  Medication Sig Dispense Refill  . atorvastatin (LIPITOR) 20 MG tablet Take 1 tablet (20 mg total) by mouth daily. (Patient not taking: Reported on 08/31/2017) 30 tablet 3  . triamcinolone cream (KENALOG) 0.1 % apply cream topically twice daily (Patient not taking: Reported on 08/31/2017) 90 g 3  . amoxicillin-clavulanate (AUGMENTIN) 875-125 MG tablet Take 1 tablet by mouth 2 (two) times daily. (Patient not taking: Reported on 08/31/2017) 20 tablet 0  . cyclobenzaprine (FLEXERIL) 5 MG tablet Take 1 tablet (5 mg total) by mouth at bedtime. (Patient not taking: Reported on 08/31/2017) 5 tablet 0  . diclofenac (VOLTAREN) 75 MG EC tablet Take 1 tablet (75  mg total) by mouth 2 (two) times daily. (Patient not taking: Reported on 08/31/2017) 30 tablet 0  . famotidine (PEPCID) 20 MG tablet Take 1 tablet (20 mg total) by mouth 2 (two) times daily. (Patient not taking: Reported on 08/31/2017) 30 tablet 0   No facility-administered medications prior to visit.     No Known Allergies  Review of Systems  Constitutional: Negative for chills, fever and malaise/fatigue.  HENT: Negative for congestion and hearing loss.   Eyes: Negative for discharge.  Respiratory: Negative for cough, sputum production and shortness of breath.   Cardiovascular: Negative for chest pain, palpitations and leg swelling.  Gastrointestinal: Negative for abdominal pain, blood in stool, constipation, diarrhea, heartburn, nausea and vomiting.  Genitourinary: Negative for dysuria, frequency, hematuria and urgency.  Musculoskeletal: Negative for back pain, falls and myalgias.  Skin: Negative for rash.  Neurological: Negative  for dizziness, sensory change, loss of consciousness, weakness and headaches.  Endo/Heme/Allergies: Negative for environmental allergies. Does not bruise/bleed easily.  Psychiatric/Behavioral: Negative for depression and suicidal ideas. The patient is not nervous/anxious and does not have insomnia.        Objective:    Physical Exam  Constitutional: She is oriented to person, place, and time. She appears well-developed and well-nourished.  HENT:  Head: Normocephalic and atraumatic.  Eyes: Conjunctivae and EOM are normal.  Neck: Normal range of motion. Neck supple. No JVD present. Carotid bruit is not present. No thyromegaly present.  Cardiovascular: Normal rate, regular rhythm and normal heart sounds.  No murmur heard. Pulmonary/Chest: Effort normal and breath sounds normal. No respiratory distress. She has no wheezes. She has no rales. She exhibits no tenderness.  Musculoskeletal: She exhibits no edema.  Neurological: She is alert and oriented to person, place, and time.  Psychiatric: She has a normal mood and affect. Her behavior is normal. Judgment and thought content normal.  Nursing note and vitals reviewed.   BP 132/80 (BP Location: Left Arm, Patient Position: Sitting, Cuff Size: Normal)   Pulse 77   Temp 98.3 F (36.8 C) (Oral)   Resp 16   Ht 5\' 5"  (1.651 m)   Wt 171 lb 3.2 oz (77.7 kg)   SpO2 98%   BMI 28.49 kg/m  Wt Readings from Last 3 Encounters:  08/31/17 171 lb 3.2 oz (77.7 kg)  03/14/17 172 lb (78 kg)  02/28/17 176 lb 9.6 oz (80.1 kg)   BP Readings from Last 3 Encounters:  08/31/17 132/80  03/14/17 (!) 133/97  02/28/17 140/90     Immunization History  Administered Date(s) Administered  . Tdap 08/10/2014    Health Maintenance  Topic Date Due  . INFLUENZA VACCINE  09/09/2017 (Originally 01/10/2017)  . PAP SMEAR  09/21/2018  . MAMMOGRAM  02/23/2019  . COLONOSCOPY  02/26/2022  . TETANUS/TDAP  08/09/2024  . Hepatitis C Screening  Completed  . HIV  Screening  Completed    Lab Results  Component Value Date   WBC 6.2 02/22/2017   HGB 12.7 02/22/2017   HCT 40.8 02/22/2017   PLT 179.0 02/22/2017   GLUCOSE 93 08/31/2017   CHOL 267 (H) 08/31/2017   TRIG 89.0 08/31/2017   HDL 61.30 08/31/2017   LDLDIRECT 192.0 02/22/2017   LDLCALC 188 (H) 08/31/2017   ALT 19 08/31/2017   AST 19 08/31/2017   NA 141 08/31/2017   K 4.3 08/31/2017   CL 103 08/31/2017   CREATININE 0.85 08/31/2017   BUN 11 08/31/2017   CO2 31 08/31/2017   TSH 1.39  02/22/2017    Lab Results  Component Value Date   TSH 1.39 02/22/2017   Lab Results  Component Value Date   WBC 6.2 02/22/2017   HGB 12.7 02/22/2017   HCT 40.8 02/22/2017   MCV 74.5 (L) 02/22/2017   PLT 179.0 02/22/2017   Lab Results  Component Value Date   NA 141 08/31/2017   K 4.3 08/31/2017   CO2 31 08/31/2017   GLUCOSE 93 08/31/2017   BUN 11 08/31/2017   CREATININE 0.85 08/31/2017   BILITOT 0.8 08/31/2017   ALKPHOS 101 08/31/2017   AST 19 08/31/2017   ALT 19 08/31/2017   PROT 8.3 08/31/2017   ALBUMIN 4.6 08/31/2017   CALCIUM 10.3 08/31/2017   ANIONGAP 7 07/06/2016   GFR 89.18 08/31/2017   Lab Results  Component Value Date   CHOL 267 (H) 08/31/2017   Lab Results  Component Value Date   HDL 61.30 08/31/2017   Lab Results  Component Value Date   LDLCALC 188 (H) 08/31/2017   Lab Results  Component Value Date   TRIG 89.0 08/31/2017   Lab Results  Component Value Date   CHOLHDL 4 08/31/2017   No results found for: HGBA1C       Assessment & Plan:   Problem List Items Addressed This Visit      Unprioritized   Hyperlipidemia LDL goal <100 - Primary    Encouraged heart healthy diet, increase exercise, avoid trans fats, consider a krill oil cap daily  con't lipitor      Relevant Orders   Lipid panel (Completed)   Comprehensive metabolic panel (Completed)   Slow transit constipation    Inc fiber in diet--- benefiber, metamucil Can use miralax  Drink plenty of  water         I have discontinued Shanisha Dockham's diclofenac, famotidine, cyclobenzaprine, and amoxicillin-clavulanate. I am also having her maintain her triamcinolone cream and atorvastatin.  No orders of the defined types were placed in this encounter.   CMA served as Education administrator during this visit. History, Physical and Plan performed by medical provider. Documentation and orders reviewed and attested to.  Ann Held, DO

## 2017-08-31 NOTE — Patient Instructions (Signed)

## 2017-08-31 NOTE — Assessment & Plan Note (Signed)
Inc fiber in diet--- benefiber, metamucil Can use miralax  Drink plenty of water

## 2017-09-01 ENCOUNTER — Other Ambulatory Visit: Payer: Self-pay | Admitting: Family Medicine

## 2017-09-01 DIAGNOSIS — E785 Hyperlipidemia, unspecified: Secondary | ICD-10-CM

## 2017-09-01 MED ORDER — ATORVASTATIN CALCIUM 20 MG PO TABS: 20.0000 mg | ORAL_TABLET | Freq: Every day | ORAL | 3 refills | Status: DC

## 2017-09-01 NOTE — Assessment & Plan Note (Addendum)
Encouraged heart healthy diet, increase exercise, avoid trans fats, consider a krill oil cap daily con't lipitor 

## 2017-09-03 ENCOUNTER — Telehealth: Payer: Self-pay | Admitting: *Deleted

## 2017-09-03 DIAGNOSIS — E785 Hyperlipidemia, unspecified: Secondary | ICD-10-CM

## 2017-09-03 NOTE — Telephone Encounter (Signed)
Copied from [facility] [phone]. Topic: Inquiry >> Sep 03, 2017 12:29 PM Con[place] Stacy Bond, NT wrote: Patient is calling and states she received her lab results on my chart but would like to discuss them over the phone. No CRM in here that our nurses can do that. Please contact to go over labs

## 2017-09-04 NOTE — Addendum Note (Signed)
Addended by: Bartholome Bill on: 09/04/2017 05:37 PM   Modules accepted: Orders

## 2017-09-05 MED ORDER — ATORVASTATIN CALCIUM 20 MG PO TABS: 20.0000 mg | ORAL_TABLET | Freq: Every day | ORAL | 3 refills | Status: DC

## 2017-09-05 NOTE — Telephone Encounter (Signed)
Spoke with patient about lab work.  She will start the lipitor.

## 2017-12-03 ENCOUNTER — Other Ambulatory Visit (INDEPENDENT_AMBULATORY_CARE_PROVIDER_SITE_OTHER): Payer: Self-pay

## 2017-12-03 DIAGNOSIS — E785 Hyperlipidemia, unspecified: Secondary | ICD-10-CM

## 2017-12-03 LAB — COMPREHENSIVE METABOLIC PANEL
ALBUMIN: 4.3 g/dL (ref 3.5–5.2)
ALK PHOS: 83 U/L (ref 39–117)
ALT: 11 U/L (ref 0–35)
AST: 16 U/L (ref 0–37)
BILIRUBIN TOTAL: 0.7 mg/dL (ref 0.2–1.2)
BUN: 16 mg/dL (ref 6–23)
CO2: 32 mEq/L (ref 19–32)
Calcium: 9.6 mg/dL (ref 8.4–10.5)
Chloride: 101 mEq/L (ref 96–112)
Creatinine, Ser: 0.88 mg/dL (ref 0.40–1.20)
GFR: 85.6 mL/min (ref >60.00)
GLUCOSE: 98 mg/dL (ref 70–99)
POTASSIUM: 4 meq/L (ref 3.5–5.1)
Sodium: 141 mEq/L (ref 135–145)
TOTAL PROTEIN: 7.1 g/dL (ref 6.0–8.3)

## 2017-12-03 LAB — LIPID PANEL
CHOLESTEROL: 204 mg/dL — AB (ref 0–200)
HDL: 60.6 mg/dL (ref >39.00)
LDL Cholesterol: 127 mg/dL — ABNORMAL HIGH (ref 0–99)
NONHDL: 142.93
Total CHOL/HDL Ratio: 3
Triglycerides: 78 mg/dL (ref 0.0–149.0)
VLDL: 15.6 mg/dL (ref 0.0–40.0)

## 2017-12-06 ENCOUNTER — Other Ambulatory Visit: Payer: Self-pay

## 2017-12-06 DIAGNOSIS — E785 Hyperlipidemia, unspecified: Secondary | ICD-10-CM

## 2017-12-11 ENCOUNTER — Telehealth: Payer: Self-pay | Admitting: *Deleted

## 2017-12-11 NOTE — Telephone Encounter (Signed)
Left message on machine to call back  Patient had labs done on 12/03/17 see labs.  Looks like next check is 3-6 months.  She needs to keep taking current medication.  Looks like her cholesterol is going down.

## 2017-12-11 NOTE — Telephone Encounter (Signed)
Copied from [facility] [phone]. Topic: Inquiry >> Dec 11, 2017 12:46 PM Pricilla Handler wrote: Reason for CRM: Patient called requesting is she can come in for a lab only. Patient is concerned about her cholesterol level, and wants to have her labs done asap. Please call patient at [phone].       Thank You!!!

## 2018-03-11 ENCOUNTER — Other Ambulatory Visit (INDEPENDENT_AMBULATORY_CARE_PROVIDER_SITE_OTHER): Payer: Self-pay

## 2018-03-11 DIAGNOSIS — E785 Hyperlipidemia, unspecified: Secondary | ICD-10-CM

## 2018-03-11 LAB — LIPID PANEL
CHOLESTEROL: 243 mg/dL — AB (ref 0–200)
HDL: 50.4 mg/dL (ref >39.00)
LDL CALC: 158 mg/dL — AB (ref 0–99)
NonHDL: 192.1
TRIGLYCERIDES: 169 mg/dL — AB (ref 0.0–149.0)
Total CHOL/HDL Ratio: 5
VLDL: 33.8 mg/dL (ref 0.0–40.0)

## 2018-03-11 LAB — COMPREHENSIVE METABOLIC PANEL
ALBUMIN: 4.2 g/dL (ref 3.5–5.2)
ALK PHOS: 84 U/L (ref 39–117)
ALT: 15 U/L (ref 0–35)
AST: 19 U/L (ref 0–37)
BUN: 11 mg/dL (ref 6–23)
CALCIUM: 9.6 mg/dL (ref 8.4–10.5)
CHLORIDE: 103 meq/L (ref 96–112)
CO2: 33 mEq/L — ABNORMAL HIGH (ref 19–32)
CREATININE: 0.86 mg/dL (ref 0.40–1.20)
GFR: 87.82 mL/min (ref >60.00)
Glucose, Bld: 91 mg/dL (ref 70–99)
POTASSIUM: 4 meq/L (ref 3.5–5.1)
Sodium: 142 mEq/L (ref 135–145)
TOTAL PROTEIN: 7.2 g/dL (ref 6.0–8.3)
Total Bilirubin: 0.7 mg/dL (ref 0.2–1.2)

## 2018-03-14 MED ORDER — ATORVASTATIN CALCIUM 40 MG PO TABS: 40.0000 mg | ORAL_TABLET | Freq: Every day | ORAL | 2 refills | Status: DC

## 2018-03-14 NOTE — Addendum Note (Signed)
Addended byDamita Dunnings D on: 03/14/2018 02:06 PM   Modules accepted: Orders

## 2018-04-23 ENCOUNTER — Encounter: Payer: Self-pay | Admitting: Family Medicine

## 2018-04-23 ENCOUNTER — Ambulatory Visit (INDEPENDENT_AMBULATORY_CARE_PROVIDER_SITE_OTHER): Payer: Self-pay | Admitting: Family Medicine

## 2018-04-23 VITALS — BP 136/82 | HR 81 | Temp 98.2°F | Resp 16 | Ht 65.0 in | Wt 180.2 lb

## 2018-04-23 DIAGNOSIS — Z8249 Family history of ischemic heart disease and other diseases of the circulatory system: Secondary | ICD-10-CM

## 2018-04-23 NOTE — Patient Instructions (Signed)
Pulmonary Embolism A pulmonary embolism (PE) is a sudden blockage or decrease of blood flow in one lung or both lungs. Most blockages come from a blood clot that forms in a lower leg, thigh, or arm vein (deep vein thrombosis, DVT) and travels to the lungs. A clot is blood that has thickened into a gel or solid. PE is a dangerous and life-threatening condition that needs to be treated right away. What are the causes? This condition is usually caused by a blood clot that forms in a vein and moves to the lungs. In rare cases, it may be caused by air, fat, part of a tumor, or other tissue that moves through the veins and into the lungs. What increases the risk? The following factors may make you more likely to develop this condition:  Having DVT or a history of DVT.  Being older than age 60.  Personal or family history of blood clots or blood clotting disease.  Major or lengthy surgery.  Orthopedic surgery, especially hip or knee replacement.  Traumatic injury, such as breaking a hip or leg.  Spinal cord injury.  Stroke.  Taking medicines that contain estrogen. These include birth control pills and hormone replacement therapy.  Long-term (chronic) lung or heart disease.  Cancer and chemotherapy.  Having a central venous catheter.  Pregnancy and the period after delivery.  What are the signs or symptoms? Symptoms of this condition usually start suddenly and include:  Shortness of breath while active or at rest.  Coughing or coughing up blood or blood-tinged mucus.  Chest pain that is often worse with deep breaths.  Rapid or irregular heartbeat.  Feeling light-headed or dizzy.  Fainting.  Feeling anxious.  Sweating.  Pain and swelling in a leg. This is a symptom of DVT, which can lead to PE.  How is this diagnosed? This condition may be diagnosed based on:  Your medical history.  A physical exam.  Blood tests to check blood oxygen level and how well your blood  clots, and a D-dimer blood test, which checks your blood for a substance that is released when a blood clot breaks apart.  CT pulmonary angiogram. This test checks blood flow in and around your lungs.  Ventilation-perfusion scan, also called a lung VQ scan. This test measures air flow and blood flow to the lungs.  Ultrasound of the legs to look for blood clots.  How is this treated? Treatment for this conditions depends on many factors, such as the cause of your PE, your risk for bleeding or developing more clots, and other medical conditions you have. Treatment aims to remove, dissolve, or stop blood clots from forming or growing larger. Treatment may include:  Blood thinning medicines (anticoagulants) to stop clots from forming or growing. These medicines may be given as a pill, as an injection, or through an IV tube (infusion).  Medicines that dissolve clots (thrombolytics).  A procedure in which a flexible tube is used to remove a blood clot (embolectomy) or deliver medicine to destroy it (catheter-directed thrombolysis).  A procedure in which a filter is inserted into a large vein that carries blood to the heart (inferior vena cava). This filter (vena cava filter) catches blood clots before they reach the lungs.  Surgery to remove the clot (surgical embolectomy). This is rare.  You may need a combination of immediate, long-term (up to 3 months after diagnosis), and extended (more than 3 months after diagnosis) treatments. Your treatment may continue for several months (maintenance therapy).   You and your health care provider will work together to choose the treatment program that is best for you. Follow these instructions at home: If you are taking an anticoagulant medicine:  Take the medicine every day at the same time each day.  Understand what foods and drugs interact with your medicine.  Understand the side effects of this medicine, including excessive bruising or bleeding. Ask  your health care provider or pharmacist about other side effects. General instructions  Take over-the-counter and prescription medicines only as told by your health care provider.  Anticoagulant medicines may cause side effects, including easy bruising and difficulty stopping bleeding. If you are prescribed an anticoagulant: ? Hold pressure over cuts for longer than usual. ? Tell your dentist and other health care providers that you are taking anticoagulants before you have any procedure that may cause bleeding. ? Avoid contact sports. ? Be extra careful when handling sharp objects. ? Use a soft toothbrush. Floss with waxed dental floss. ? Shave with an electric razor.  Wear a medical alert bracelet or carry a medical alert card that says you have had a PE.  Ask your health care provider when you may return to your normal activities.  Talk with your health care provider about any travel plans. It is important to make sure that you are still able to take your medicine while on trips.  Keep all follow-up visits as told by your health care provider. This is important. How is this prevented? Take these actions to lower your risk of developing another PE:  Exercise regularly. Take frequent walks. For at least 30 minutes every day, engage in: ? Activity that involves moving your arms and legs. ? Activity that encourages good blood flow through your body by increasing your heart rate.  While traveling, drink plenty of water and avoid drinking alcohol. Ask your health care provider if you should wear below-the-knee compression stockings.  Avoid sitting or lying in bed for long periods of time without moving your legs. Exercise your arms and legs every hour during long-distance travel (over 4 hours).  If you are hospitalized or have surgery, ask your health care provider about your risks and what treatments can help prevent blood clots.  Maintain a healthy weight. Ask your health care  provider what weight is healthy for you.  If you are a woman who is over age 35, avoid unnecessary use of medicines that contain estrogen, including birth control pills.  Do not use any products that contain nicotine or tobacco, such as cigarettes and e-cigarettes. This is especially important if you take estrogen medicines. If you need help quitting, ask your health care provider.  See your health care provider for regular checkups. This may include blood tests and ultrasound testing on your legs to check for new blood clots.  Contact a health care provider if:  You missed a dose of your blood thinner medicine. Get help right away if:  You have new or increased pain, swelling, warmth, or redness in an arm or leg.  You have numbness or tingling in an arm or leg.  You have shortness of breath while active or at rest.  You have chest pain.  You have a rapid or irregular heartbeat.  You feel light-headed or dizzy.  You cough up blood.  You have blood in your vomit, stool, or urine.  You have a fever.  You have abdomen (abdominal) pain.  You have a severe fall or head injury.  You have a   severe headache.  You have vision changes.  You cannot move your arms or legs.  You are confused or have memory loss.  You are bleeding for 10 minutes or more, even with strong pressure on the wound. These symptoms may represent a serious problem that is an emergency. Do not wait to see if the symptoms will go away. Get medical help right away. Call your local emergency services (911 in the [place] Do not drive yourself to the hospital. Summary  A pulmonary embolism (PE) is a sudden blockage or decrease of blood flow in one lung or both lungs. PE is a dangerous and life-threatening condition that needs to be treated right away.  Having deep vein thrombosis (DVT) or a history of DVT is the most common risk factor for PE.  Treatments for this condition usually include medicines to thin  your blood (anticoagulants) or medicines to break apart blood clots (thrombolytics).  If you are prescribed blood thinners, it is important to take the medicine every single day at the same time each day.  If you have signs of PE or DVT, call your local emergency services (911 in the [place] This information is not intended to replace advice given to you by your health care provider. Make sure you discuss any questions you have with your health care provider. Document Released: 05/26/2000 Document Revised: 07/01/2016 Document Reviewed: 07/01/2016 Elsevier Interactive Patient Education  2018 [employer]vier Inc.  

## 2018-04-23 NOTE — Progress Notes (Signed)
Stacy Bond ID: Stacy Bond, female    DOB: 11-07-1960  Age: 57 y.o. MRN: [id number]    Subjective:  Subjective  HPI Stacy Bond presents to discuss family hx blood clot.  Her brother recently died from PE and their are other family members with same hx and her daughter was dx with clotting disorder-  She does not know which one.  She has no symptoms No cp, no sob, no palp, no leg pain.    Review of Systems  Constitutional: Negative for chills and fever.  HENT: Negative for congestion and hearing loss.   Eyes: Negative for discharge.  Respiratory: Negative for cough and shortness of breath.   Cardiovascular: Negative for chest pain, palpitations and leg swelling.  Gastrointestinal: Negative for abdominal pain, blood in stool, constipation, diarrhea, nausea and vomiting.  Genitourinary: Negative for dysuria, frequency, hematuria and urgency.  Musculoskeletal: Negative for back pain and myalgias.  Skin: Negative for rash.  Allergic/Immunologic: Negative for environmental allergies.  Neurological: Negative for dizziness, weakness and headaches.  Hematological: Does not bruise/bleed easily.  Psychiatric/Behavioral: Negative for suicidal ideas. The Stacy Bond is not nervous/anxious.     History Past Medical History:  Diagnosis Date  . Anemia   . Fibroid   . Hemorrhoids     She has no past surgical history on file.   Her family history includes Clotting disorder in her daughter; Diabetes in her father; Hypertension in her mother; Pulmonary embolism in her brother and mother.She reports that she has never smoked. She has never used smokeless tobacco. She reports that she drinks about 2.0 standard drinks of alcohol per week. She reports that she does not use drugs.  Current Outpatient Medications on File Prior to Visit  Medication Sig Dispense Refill  . Flaxseed, Linseed, (FLAX SEEDS PO) Take 1 capsule by mouth daily.    . Red Yeast Rice Extract (RED YEAST RICE PO) Take 15 mLs by mouth  daily.     No current facility-administered medications on file prior to visit.      Objective:  Objective  Physical Exam  Constitutional: She is oriented to person, place, and time. She appears well-developed and well-nourished.  HENT:  Head: Normocephalic and atraumatic.  Eyes: Conjunctivae and EOM are normal.  Neck: Normal range of motion. Neck supple. No JVD present. Carotid bruit is not present. No thyromegaly present.  Cardiovascular: Normal rate, regular rhythm and normal heart sounds.  No murmur heard. Pulmonary/Chest: Effort normal and breath sounds normal. No respiratory distress. She has no wheezes. She has no rales. She exhibits no tenderness.  Musculoskeletal: She exhibits no edema or tenderness.  Neurological: She is alert and oriented to person, place, and time.  Psychiatric: She has a normal mood and affect.  Nursing note and vitals reviewed.  BP 136/82 (BP Location: Left Arm, Cuff Size: Large)   Pulse 81   Temp 98.2 F (36.8 C) (Oral)   Resp 16   Ht 5\' 5"  (1.651 m)   Wt 180 lb 3.2 oz (81.7 kg)   SpO2 100%   BMI 29.99 kg/m  Wt Readings from Last 3 Encounters:  04/23/18 180 lb 3.2 oz (81.7 kg)  08/31/17 171 lb 3.2 oz (77.7 kg)  03/14/17 172 lb (78 kg)     Lab Results  Component Value Date   WBC 6.2 02/22/2017   HGB 12.7 02/22/2017   HCT 40.8 02/22/2017   PLT 179.0 02/22/2017   GLUCOSE 91 03/11/2018   CHOL 243 (H) 03/11/2018   TRIG 169.0 (H)  03/11/2018   HDL 50.40 03/11/2018   LDLDIRECT 192.0 02/22/2017   LDLCALC 158 (H) 03/11/2018   ALT 15 03/11/2018   AST 19 03/11/2018   NA 142 03/11/2018   K 4.0 03/11/2018   CL 103 03/11/2018   CREATININE 0.86 03/11/2018   BUN 11 03/11/2018   CO2 33 (H) 03/11/2018   TSH 1.39 02/22/2017   INR 1.1 (H) 04/23/2018    Ct Head Wo Contrast  Result Date: 03/06/2017 CLINICAL DATA:  Chronic headache for approximately 1 month EXAM: CT HEAD WITHOUT CONTRAST TECHNIQUE: Contiguous axial images were obtained from the  base of the skull through the vertex without intravenous contrast. COMPARISON:  None. FINDINGS: Brain: The ventricles are normal in size and configuration. There is invagination of CSF into the sella consistent with a degree of empty sella. There is no intracranial mass, hemorrhage, extra-axial fluid collection, or midline shift. Gray-white compartments appear normal. No acute infarct evident. Vascular: No hyperdense vessel. No appreciable vascular calcification. Skull: Bony calvarium appears intact. Sinuses/Orbits: There is mucosal thickening in several ethmoid air cells bilaterally. Other visualized paranasal sinuses are clear. Visualized orbits appear symmetric bilaterally. Other: Mastoid air cells are clear. IMPRESSION: 1. There is a degree of empty sella. Significance of this finding uncertain. 2. No intracranial mass, hemorrhage, or extra-axial fluid collection. Gray-white compartments appear normal. 3. There are foci of mucosal thickening in several ethmoid air cells. Electronically Signed   By: Lowella Grip III M.D.   On: 03/06/2017 10:25     Assessment & Plan:  Plan  I have discontinued Anitha Luster's triamcinolone cream and atorvastatin. I am also having her maintain her (Flaxseed, Linseed, (FLAX SEEDS PO)) and Red Yeast Rice Extract (RED YEAST RICE PO).  No orders of the defined types were placed in this encounter.   Problem List Items Addressed This Visit    None    Visit Diagnoses    Family history of pulmonary embolism    -  Primary   Relevant Orders   Antithrombin III   Protein C activity   Protein C, total   Protein S activity   Protein S, total   Beta-2-glycoprotein i abs, IgG/M/A   Homocysteine, serum   Factor 5 leiden   Prothrombin gene mutation   Cardiolipin antibodies, IgG, IgM, IgA (Completed)   PTT (Completed)   Protime-INR ( [facility] ONLY) (Completed)   D-Dimer, Quantitative (Completed)   Lupus Anticoagulant Panel    consider genetic counselor  Check labs     Follow-up: No follow-ups on file.  Ann Held, DO

## 2018-04-24 ENCOUNTER — Encounter: Payer: Self-pay | Admitting: Family Medicine

## 2018-04-24 LAB — PROTIME-INR
INR: 1.1 ratio — ABNORMAL HIGH (ref 0.8–1.0)
Prothrombin Time: 12.3 s (ref 9.6–13.1)

## 2018-04-24 LAB — APTT: APTT: 30.9 s (ref 23.4–32.7)

## 2018-04-24 LAB — D-DIMER, QUANTITATIVE (NOT AT ARMC): D DIMER QUANT: 0.27 ug{FEU}/mL (ref <0.50)

## 2018-04-29 LAB — PROTEIN C ACTIVITY: Protein C Activity: 104 % (ref 70–180)

## 2018-04-29 LAB — PROTEIN S ACTIVITY: Protein S Activity: 96 % (ref 60–140)

## 2018-04-29 LAB — FACTOR 5 LEIDEN

## 2018-04-29 LAB — PROTEIN C, TOTAL: PROTEIN C, ANTIGEN: 103 % (ref 70–140)

## 2018-04-29 LAB — CARDIOLIPIN ANTIBODIES, IGG, IGM, IGA
Anticardiolipin IgA: <11 [APL'U]
Anticardiolipin IgG: <14 [GPL'U]
Anticardiolipin IgM: <12 [MPL'U]

## 2018-04-29 LAB — PROTHROMBIN GENE MUTATION

## 2018-04-29 LAB — ANTITHROMBIN III: AntiThromb III Func: 122 % activity — ABNORMAL HIGH (ref 80–120)

## 2018-04-29 LAB — PROTEIN S, TOTAL: PROTEIN S ANTIGEN, TOTAL: 164 %{normal} — AB (ref 70–140)

## 2018-04-29 LAB — HOMOCYSTEINE: HOMOCYSTEINE: 10.8 umol/L — AB (ref <10.4)

## 2018-05-04 LAB — LUPUS ANTICOAGULANT PANEL
APTT: 26.9 s
DRVVT SCREEN SECONDS: 37.2 s
Hexagonal Phospholipid Neutral: 0 s
INR: 1.1 ratio
PLATELET NEUTRALIZATION: 0 s
Prothrombin Time: 11.5 s
THROMBIN TIME: 22.6 s

## 2018-05-06 ENCOUNTER — Telehealth: Payer: Self-pay | Admitting: *Deleted

## 2018-05-06 NOTE — Telephone Encounter (Signed)
Received Lab Report results from [facility]; forwarded to provider/SLS 11/25

## 2018-05-14 ENCOUNTER — Other Ambulatory Visit: Payer: Self-pay | Admitting: Family Medicine

## 2018-05-14 ENCOUNTER — Telehealth: Payer: Self-pay | Admitting: Family Medicine

## 2018-05-14 DIAGNOSIS — E785 Hyperlipidemia, unspecified: Secondary | ICD-10-CM

## 2018-05-14 MED ORDER — ATORVASTATIN CALCIUM 20 MG PO TABS: 20.0000 mg | ORAL_TABLET | Freq: Every day | ORAL | 3 refills | Status: DC

## 2018-05-14 NOTE — Telephone Encounter (Signed)
Pt is requesting Lipitor 20 mg tab. Not listed on her med list. In the chart it was noted this medication was discontinued on 04/23/18.   Called patient and she stated that she is out of her red yeast rice vinegar and wanted to be able to take something for her cholesterol. She is leaving to go out of the country tomorrow for 5 weeks. She lost her brother and her dad is 20 and she is trying to help him.  So is asking for a couple of bottles so take with her for cholesterol.

## 2018-05-14 NOTE — Telephone Encounter (Signed)
I sent it in 

## 2018-05-14 NOTE — Telephone Encounter (Signed)
Copied from [place] [id number]. Topic: Quick Communication - Rx Refill/Question >> May 14, 2018  8:08 AM Leward Quan A wrote: Medication: atorvastatin (LIPITOR) 20 MG tablet   Patient say that she is going away and need the medication. She will be away for about 5 weeks   Has the patient contacted their pharmacy? Yes.   (Agent: If no, request that the patient contact the pharmacy for the refill.) (Agent: If yes, when and what did the pharmacy advise?)  Preferred Pharmacy (with phone number or street name): [place], [place]. [phone] (Phone) [phone] (Fax)    Agent: Please be advised that RX refills may take up to 3 business days. We ask that you follow-up with your pharmacy.

## 2018-05-15 NOTE — Telephone Encounter (Signed)
Patient picked up medication on yesterday

## 2018-07-11 ENCOUNTER — Ambulatory Visit: Payer: Self-pay | Admitting: Family Medicine

## 2018-07-22 ENCOUNTER — Ambulatory Visit: Payer: Self-pay | Admitting: Family Medicine

## 2018-08-06 ENCOUNTER — Encounter: Payer: Self-pay | Admitting: Family Medicine

## 2018-08-06 ENCOUNTER — Ambulatory Visit: Payer: Self-pay | Admitting: Family Medicine

## 2018-08-06 VITALS — BP 136/92 | HR 90 | Temp 98.5°F | Ht 65.0 in | Wt 178.0 lb

## 2018-08-06 DIAGNOSIS — E785 Hyperlipidemia, unspecified: Secondary | ICD-10-CM

## 2018-08-06 DIAGNOSIS — K047 Periapical abscess without sinus: Secondary | ICD-10-CM

## 2018-08-06 DIAGNOSIS — I10 Essential (primary) hypertension: Secondary | ICD-10-CM

## 2018-08-06 LAB — COMPREHENSIVE METABOLIC PANEL
ALT: 20 U/L (ref 0–35)
AST: 21 U/L (ref 0–37)
Albumin: 4.6 g/dL (ref 3.5–5.2)
Alkaline Phosphatase: 87 U/L (ref 39–117)
BUN: 13 mg/dL (ref 6–23)
CHLORIDE: 103 meq/L (ref 96–112)
CO2: 32 mEq/L (ref 19–32)
Calcium: 9.9 mg/dL (ref 8.4–10.5)
Creatinine, Ser: 0.9 mg/dL (ref 0.40–1.20)
GFR: 78.29 mL/min (ref >60.00)
Glucose, Bld: 92 mg/dL (ref 70–99)
Potassium: 4.1 mEq/L (ref 3.5–5.1)
Sodium: 141 mEq/L (ref 135–145)
Total Bilirubin: 0.8 mg/dL (ref 0.2–1.2)
Total Protein: 7.7 g/dL (ref 6.0–8.3)

## 2018-08-06 LAB — LIPID PANEL
Cholesterol: 178 mg/dL (ref 0–200)
HDL: 63.6 mg/dL (ref >39.00)
LDL Cholesterol: 98 mg/dL (ref 0–99)
NONHDL: 114.13
Total CHOL/HDL Ratio: 3
Triglycerides: 80 mg/dL (ref 0.0–149.0)
VLDL: 16 mg/dL (ref 0.0–40.0)

## 2018-08-06 MED ORDER — METOPROLOL SUCCINATE ER 25 MG PO TB24: 25.0000 mg | ORAL_TABLET | Freq: Every day | ORAL | 2 refills | Status: DC

## 2018-08-06 MED ORDER — AMOXICILLIN-POT CLAVULANATE 875-125 MG PO TABS: 1.0000 | ORAL_TABLET | Freq: Two times a day (BID) | ORAL | 0 refills | Status: DC

## 2018-08-06 NOTE — Assessment & Plan Note (Signed)
Tolerating statin, encouraged heart healthy diet, avoid trans fats, minimize simple carbs and saturated fats. Increase exercise as tolerated 

## 2018-08-06 NOTE — Patient Instructions (Signed)
DASH Eating Plan  DASH stands for "Dietary Approaches to Stop Hypertension." The DASH eating plan is a healthy eating plan that has been shown to reduce high blood pressure (hypertension). It may also reduce your risk for type 2 diabetes, heart disease, and stroke. The DASH eating plan may also help with weight loss.  What are tips for following this plan?    General guidelines   Avoid eating more than 2,300 mg (milligrams) of salt (sodium) a day. If you have hypertension, you may need to reduce your sodium intake to 1,500 mg a day.   Limit alcohol intake to no more than 1 drink a day for nonpregnant women and 2 drinks a day for men. One drink equals 12 oz of beer, 5 oz of wine, or 1 oz of hard liquor.   Work with your health care provider to maintain a healthy body weight or to lose weight. Ask what an ideal weight is for you.   Get at least 30 minutes of exercise that causes your heart to beat faster (aerobic exercise) most days of the week. Activities may include walking, swimming, or biking.   Work with your health care provider or diet and nutrition specialist (dietitian) to adjust your eating plan to your individual calorie needs.  Reading food labels     Check food labels for the amount of sodium per serving. Choose foods with less than 5 percent of the Daily Value of sodium. Generally, foods with less than 300 mg of sodium per serving fit into this eating plan.   To find whole grains, look for the word "whole" as the first word in the ingredient list.  Shopping   Buy products labeled as "low-sodium" or "no salt added."   Buy fresh foods. Avoid canned foods and premade or frozen meals.  Cooking   Avoid adding salt when cooking. Use salt-free seasonings or herbs instead of table salt or sea salt. Check with your health care provider or pharmacist before using salt substitutes.   Do not fry foods. Cook foods using healthy methods such as baking, boiling, grilling, and broiling instead.   Cook with  heart-healthy oils, such as olive, canola, soybean, or sunflower oil.  Meal planning   Eat a balanced diet that includes:  ? 5 or more servings of fruits and vegetables each day. At each meal, try to fill half of your plate with fruits and vegetables.  ? Up to 6-8 servings of whole grains each day.  ? Less than 6 oz of lean meat, poultry, or fish each day. A 3-oz serving of meat is about the same size as a deck of cards. One egg equals 1 oz.  ? 2 servings of low-fat dairy each day.  ? A serving of nuts, seeds, or beans 5 times each week.  ? Heart-healthy fats. Healthy fats called Omega-3 fatty acids are found in foods such as flaxseeds and coldwater fish, like sardines, salmon, and mackerel.   Limit how much you eat of the following:  ? Canned or prepackaged foods.  ? Food that is high in trans fat, such as fried foods.  ? Food that is high in saturated fat, such as fatty meat.  ? Sweets, desserts, sugary drinks, and other foods with added sugar.  ? Full-fat dairy products.   Do not salt foods before eating.   Try to eat at least 2 vegetarian meals each week.   Eat more home-cooked food and less restaurant, buffet, and fast food.     When eating at a restaurant, ask that your food be prepared with less salt or no salt, if possible.  What foods are recommended?  The items listed may not be a complete list. Talk with your dietitian about what dietary choices are best for you.  Grains  Whole-grain or whole-wheat bread. Whole-grain or whole-wheat pasta. Brown rice. Oatmeal. Quinoa. Bulgur. Whole-grain and low-sodium cereals. Pita bread. Low-fat, low-sodium crackers. Whole-wheat flour tortillas.  Vegetables  Fresh or frozen vegetables (raw, steamed, roasted, or grilled). Low-sodium or reduced-sodium tomato and vegetable juice. Low-sodium or reduced-sodium tomato sauce and tomato paste. Low-sodium or reduced-sodium canned vegetables.  Fruits  All fresh, dried, or frozen fruit. Canned fruit in natural juice (without  added sugar).  Meat and other protein foods  Skinless chicken or turkey. Ground chicken or turkey. Pork with fat trimmed off. Fish and seafood. Egg whites. Dried beans, peas, or lentils. Unsalted nuts, nut butters, and seeds. Unsalted canned beans. Lean cuts of beef with fat trimmed off. Low-sodium, lean deli meat.  Dairy  Low-fat (1%) or fat-free (skim) milk. Fat-free, low-fat, or reduced-fat cheeses. Nonfat, low-sodium ricotta or cottage cheese. Low-fat or nonfat yogurt. Low-fat, low-sodium cheese.  Fats and oils  Soft margarine without trans fats. Vegetable oil. Low-fat, reduced-fat, or light mayonnaise and salad dressings (reduced-sodium). Canola, safflower, olive, soybean, and sunflower oils. Avocado.  Seasoning and other foods  Herbs. Spices. Seasoning mixes without salt. Unsalted popcorn and pretzels. Fat-free sweets.  What foods are not recommended?  The items listed may not be a complete list. Talk with your dietitian about what dietary choices are best for you.  Grains  Baked goods made with fat, such as croissants, muffins, or some breads. Dry pasta or rice meal packs.  Vegetables  Creamed or fried vegetables. Vegetables in a cheese sauce. Regular canned vegetables (not low-sodium or reduced-sodium). Regular canned tomato sauce and paste (not low-sodium or reduced-sodium). Regular tomato and vegetable juice (not low-sodium or reduced-sodium). Pickles. Olives.  Fruits  Canned fruit in a light or heavy syrup. Fried fruit. Fruit in cream or butter sauce.  Meat and other protein foods  Fatty cuts of meat. Ribs. Fried meat. Bacon. Sausage. Bologna and other processed lunch meats. Salami. Fatback. Hotdogs. Bratwurst. Salted nuts and seeds. Canned beans with added salt. Canned or smoked fish. Whole eggs or egg yolks. Chicken or turkey with skin.  Dairy  Whole or 2% milk, cream, and half-and-half. Whole or full-fat cream cheese. Whole-fat or sweetened yogurt. Full-fat cheese. Nondairy creamers. Whipped toppings.  Processed cheese and cheese spreads.  Fats and oils  Butter. Stick margarine. Lard. Shortening. Ghee. Bacon fat. Tropical oils, such as coconut, palm kernel, or palm oil.  Seasoning and other foods  Salted popcorn and pretzels. Onion salt, garlic salt, seasoned salt, table salt, and sea salt. Worcestershire sauce. Tartar sauce. Barbecue sauce. Teriyaki sauce. Soy sauce, including reduced-sodium. Steak sauce. Canned and packaged gravies. Fish sauce. Oyster sauce. Cocktail sauce. Horseradish that you find on the shelf. Ketchup. Mustard. Meat flavorings and tenderizers. Bouillon cubes. Hot sauce and Tabasco sauce. Premade or packaged marinades. Premade or packaged taco seasonings. Relishes. Regular salad dressings.  Where to find more information:   National Heart, Lung, and Blood Institute: www.nhlbi.nih.gov   American Heart Association: www.heart.org  Summary   The DASH eating plan is a healthy eating plan that has been shown to reduce high blood pressure (hypertension). It may also reduce your risk for type 2 diabetes, heart disease, and stroke.   With the   DASH eating plan, you should limit salt (sodium) intake to 2,300 mg a day. If you have hypertension, you may need to reduce your sodium intake to 1,500 mg a day.   When on the DASH eating plan, aim to eat more fresh fruits and vegetables, whole grains, lean proteins, low-fat dairy, and heart-healthy fats.   Work with your health care provider or diet and nutrition specialist (dietitian) to adjust your eating plan to your individual calorie needs.  This information is not intended to replace advice given to you by your health care provider. Make sure you discuss any questions you have with your health care provider.  Document Released: 05/18/2011 Document Revised: 05/22/2016 Document Reviewed: 05/22/2016  Elsevier Interactive Patient Education  2019 [employer] Inc.

## 2018-08-06 NOTE — Progress Notes (Signed)
Patient ID: Stacy Bond, female    DOB: 03/13/1961  Age: 58 y.o. MRN: [id number]    Subjective:  Subjective  HPI Stacy Bond presents for chol recheck and she c/o toothache -- it feels like the infection she had in the past.  The dentist did an xray but did not see anything.    Review of Systems  Constitutional: Negative for chills and fever.  HENT: Negative for congestion and hearing loss.   Eyes: Negative for discharge.  Respiratory: Negative for cough and shortness of breath.   Cardiovascular: Negative for chest pain, palpitations and leg swelling.  Gastrointestinal: Negative for abdominal pain, blood in stool, constipation, diarrhea, nausea and vomiting.  Genitourinary: Negative for dysuria, frequency, hematuria and urgency.  Musculoskeletal: Negative for back pain and myalgias.  Skin: Negative for rash.  Allergic/Immunologic: Negative for environmental allergies.  Neurological: Negative for dizziness, weakness and headaches.  Hematological: Does not bruise/bleed easily.  Psychiatric/Behavioral: Negative for suicidal ideas. The patient is not nervous/anxious.     History Past Medical History:  Diagnosis Date  . Anemia   . Fibroid   . Hemorrhoids     She has no past surgical history on file.   Her family history includes Clotting disorder in her daughter; Diabetes in her father; Hypertension in her mother; Pulmonary embolism in her brother and mother.She reports that she has never smoked. She has never used smokeless tobacco. She reports current alcohol use of about 2.0 standard drinks of alcohol per week. She reports that she does not use drugs.  Current Outpatient Medications on File Prior to Visit  Medication Sig Dispense Refill  . atorvastatin (LIPITOR) 20 MG tablet Take 1 tablet (20 mg total) by mouth daily. 90 tablet 3  . Flaxseed, Linseed, (FLAX SEEDS PO) Take 1 capsule by mouth daily.    . Red Yeast Rice Extract (RED YEAST RICE PO) Take 15 mLs by mouth daily.      No current facility-administered medications on file prior to visit.      Objective:  Objective  Physical Exam Vitals signs and nursing note reviewed.  Constitutional:      Appearance: She is well-developed.  HENT:     Head: Normocephalic and atraumatic.  Eyes:     Conjunctiva/sclera: Conjunctivae normal.  Neck:     Musculoskeletal: Normal range of motion and neck supple.     Thyroid: No thyromegaly.     Vascular: No carotid bruit or JVD.  Cardiovascular:     Rate and Rhythm: Normal rate and regular rhythm.     Heart sounds: Normal heart sounds. No murmur.  Pulmonary:     Effort: Pulmonary effort is normal. No respiratory distress.     Breath sounds: Normal breath sounds. No wheezing or rales.  Chest:     Chest wall: No tenderness.  Neurological:     Mental Status: She is alert and oriented to person, place, and time.    BP (!) 136/92   Pulse 90   Temp 98.5 F (36.9 C)   Ht 5\' 5"  (1.651 m)   Wt 178 lb (80.7 kg)   SpO2 98%   BMI 29.62 kg/m  Wt Readings from Last 3 Encounters:  08/06/18 178 lb (80.7 kg)  04/23/18 180 lb 3.2 oz (81.7 kg)  08/31/17 171 lb 3.2 oz (77.7 kg)     Lab Results  Component Value Date   WBC 6.2 02/22/2017   HGB 12.7 02/22/2017   HCT 40.8 02/22/2017   PLT 179.0 02/22/2017  GLUCOSE 91 03/11/2018   CHOL 243 (H) 03/11/2018   TRIG 169.0 (H) 03/11/2018   HDL 50.40 03/11/2018   LDLDIRECT 192.0 02/22/2017   LDLCALC 158 (H) 03/11/2018   ALT 15 03/11/2018   AST 19 03/11/2018   NA 142 03/11/2018   K 4.0 03/11/2018   CL 103 03/11/2018   CREATININE 0.86 03/11/2018   BUN 11 03/11/2018   CO2 33 (H) 03/11/2018   TSH 1.39 02/22/2017   INR 1.1 (H) 04/23/2018   INR 1.1 04/23/2018    Ct Head Wo Contrast  Result Date: 03/06/2017 CLINICAL DATA:  Chronic headache for approximately 1 month EXAM: CT HEAD WITHOUT CONTRAST TECHNIQUE: Contiguous axial images were obtained from the base of the skull through the vertex without intravenous contrast.  COMPARISON:  None. FINDINGS: Brain: The ventricles are normal in size and configuration. There is invagination of CSF into the sella consistent with a degree of empty sella. There is no intracranial mass, hemorrhage, extra-axial fluid collection, or midline shift. Gray-white compartments appear normal. No acute infarct evident. Vascular: No hyperdense vessel. No appreciable vascular calcification. Skull: Bony calvarium appears intact. Sinuses/Orbits: There is mucosal thickening in several ethmoid air cells bilaterally. Other visualized paranasal sinuses are clear. Visualized orbits appear symmetric bilaterally. Other: Mastoid air cells are clear. IMPRESSION: 1. There is a degree of empty sella. Significance of this finding uncertain. 2. No intracranial mass, hemorrhage, or extra-axial fluid collection. Gray-white compartments appear normal. 3. There are foci of mucosal thickening in several ethmoid air cells. Electronically Signed   By: Lowella Grip III M.D.   On: 03/06/2017 10:25     Assessment & Plan:  Plan  I am having Stacy Bond start on amoxicillin-clavulanate and metoprolol succinate. I am also having her maintain her (Flaxseed, Linseed, (FLAX SEEDS PO)), Red Yeast Rice Extract (RED YEAST RICE PO), and atorvastatin.  Meds ordered this encounter  Medications  . amoxicillin-clavulanate (AUGMENTIN) 875-125 MG tablet    Sig: Take 1 tablet by mouth 2 (two) times daily.    Dispense:  20 tablet    Refill:  0  . metoprolol succinate (TOPROL-XL) 25 MG 24 hr tablet    Sig: Take 1 tablet (25 mg total) by mouth daily.    Dispense:  30 tablet    Refill:  2    Problem List Items Addressed This Visit      Unprioritized   Essential hypertension    Poorly controlled will alter medications, encouraged DASH diet, minimize caffeine and obtain adequate sleep. Report concerning symptoms and follow up as directed and as needed      Relevant Medications   metoprolol succinate (TOPROL-XL) 25 MG 24 hr  tablet   Hyperlipidemia - Primary    Tolerating statin, encouraged heart healthy diet, avoid trans fats, minimize simple carbs and saturated fats. Increase exercise as tolerated      Relevant Medications   metoprolol succinate (TOPROL-XL) 25 MG 24 hr tablet   Other Relevant Orders   Lipid panel   Comprehensive metabolic panel   Tooth abscess    abx per orders F/u dentist       Relevant Medications   amoxicillin-clavulanate (AUGMENTIN) 875-125 MG tablet      Follow-up: Return in about 2 weeks (around 08/20/2018), or if symptoms worsen or fail to improve, for bp check with nurse.  Ann Held, DO

## 2018-08-06 NOTE — Assessment & Plan Note (Signed)
abx per orders F/u dentist  

## 2018-08-06 NOTE — Assessment & Plan Note (Signed)
Poorly controlled will alter medications, encouraged DASH diet, minimize caffeine and obtain adequate sleep. Report concerning symptoms and follow up as directed and as needed 

## 2018-08-07 ENCOUNTER — Other Ambulatory Visit: Payer: Self-pay | Admitting: Family Medicine

## 2018-08-07 DIAGNOSIS — E785 Hyperlipidemia, unspecified: Secondary | ICD-10-CM

## 2018-08-20 ENCOUNTER — Ambulatory Visit (INDEPENDENT_AMBULATORY_CARE_PROVIDER_SITE_OTHER): Payer: Self-pay | Admitting: Family Medicine

## 2018-08-20 VITALS — BP 127/87 | HR 71

## 2018-08-20 DIAGNOSIS — I10 Essential (primary) hypertension: Secondary | ICD-10-CM

## 2018-08-20 NOTE — Progress Notes (Signed)
Patient here for follow up blood pressure per Dr. Carollee Herter  She is currently taking metoprolol succinate 25mg  daily.  She has taken not taken medication yet today.  She usually takes it in the evening.    No missed doses.  1st blood pressure check Blood pressure: 138/90 Pulse:  71  2nd blood pressure check after 5 minutes Blood pressure:127/87 Pulse: 71  Per Dr. Carollee Herter to continue same dose and to follow up in 3 months.   Patient advised and appointment made for 11/25/18.

## 2018-08-22 ENCOUNTER — Ambulatory Visit (HOSPITAL_BASED_OUTPATIENT_CLINIC_OR_DEPARTMENT_OTHER)
Admission: RE | Admit: 2018-08-22 | Discharge: 2018-08-22 | Disposition: A | Discharge status: Home / Self Care | Payer: Self-pay | Source: Ambulatory Visit | Attending: Family Medicine | Admitting: Family Medicine

## 2018-08-22 ENCOUNTER — Ambulatory Visit (INDEPENDENT_AMBULATORY_CARE_PROVIDER_SITE_OTHER): Payer: Self-pay | Admitting: Family Medicine

## 2018-08-22 ENCOUNTER — Other Ambulatory Visit: Payer: Self-pay | Admitting: Family Medicine

## 2018-08-22 ENCOUNTER — Encounter: Payer: Self-pay | Admitting: Family Medicine

## 2018-08-22 ENCOUNTER — Other Ambulatory Visit: Payer: Self-pay

## 2018-08-22 VITALS — BP 130/86 | HR 85 | Temp 98.5°F | Resp 12 | Ht 65.0 in | Wt 180.2 lb

## 2018-08-22 DIAGNOSIS — M542 Cervicalgia: Secondary | ICD-10-CM

## 2018-08-22 DIAGNOSIS — R591 Generalized enlarged lymph nodes: Secondary | ICD-10-CM

## 2018-08-22 MED ORDER — CEFDINIR 300 MG PO CAPS: 300.0000 mg | ORAL_CAPSULE | Freq: Two times a day (BID) | ORAL | 0 refills | Status: DC

## 2018-08-22 NOTE — Patient Instructions (Signed)
We have ordered an ultrasound of your neck And we will check some labs also  Call if your symptoms change/ worsen

## 2018-08-22 NOTE — Progress Notes (Signed)
Patient ID: Stacy Bond, female    DOB: 27-Jun-1960  Age: 58 y.o. MRN: [id number]    Subjective:  Subjective  HPI Stacy Bond presents for tender spot L side of neck that is not improving .    abx did not seem to help.  No other symptoms   Review of Systems  Constitutional: Negative for appetite change, diaphoresis, fatigue and unexpected weight change.  Eyes: Negative for pain, redness and visual disturbance.  Respiratory: Negative for cough, chest tightness, shortness of breath and wheezing.   Cardiovascular: Negative for chest pain, palpitations and leg swelling.  Endocrine: Negative for cold intolerance, heat intolerance, polydipsia, polyphagia and polyuria.  Genitourinary: Negative for difficulty urinating, dysuria and frequency.  Musculoskeletal: Positive for neck pain.  Neurological: Negative for dizziness, light-headedness, numbness and headaches.    History Past Medical History:  Diagnosis Date  . Anemia   . Fibroid   . Hemorrhoids     She has no past surgical history on file.   Her family history includes Clotting disorder in her daughter; Diabetes in her father; Hypertension in her mother; Pulmonary embolism in her brother and mother.She reports that she has never smoked. She has never used smokeless tobacco. She reports current alcohol use of about 2.0 standard drinks of alcohol per week. She reports that she does not use drugs.  Current Outpatient Medications on File Prior to Visit  Medication Sig Dispense Refill  . atorvastatin (LIPITOR) 20 MG tablet Take 1 tablet (20 mg total) by mouth daily. 90 tablet 3  . metoprolol succinate (TOPROL-XL) 25 MG 24 hr tablet Take 1 tablet (25 mg total) by mouth daily. 30 tablet 2   No current facility-administered medications on file prior to visit.      Objective:  Objective  Physical Exam Vitals signs and nursing note reviewed.  Constitutional:      Appearance: She is well-developed.  HENT:     Head: Normocephalic and  atraumatic.  Eyes:     Conjunctiva/sclera: Conjunctivae normal.  Neck:     Musculoskeletal: Normal range of motion and neck supple.     Thyroid: No thyromegaly.     Vascular: No carotid bruit or JVD.  Cardiovascular:     Rate and Rhythm: Normal rate and regular rhythm.     Heart sounds: Normal heart sounds. No murmur.  Pulmonary:     Effort: Pulmonary effort is normal. No respiratory distress.     Breath sounds: Normal breath sounds. No wheezing or rales.  Chest:     Chest wall: No tenderness.  Neurological:     Mental Status: She is alert and oriented to person, place, and time.    BP 130/86 (BP Location: Right Arm, Cuff Size: Normal)   Pulse 85   Temp 98.5 F (36.9 C) (Oral)   Resp 12   Ht 5\' 5"  (1.651 m)   Wt 180 lb 3.2 oz (81.7 kg)   SpO2 98%   BMI 29.99 kg/m  Wt Readings from Last 3 Encounters:  08/22/18 180 lb 3.2 oz (81.7 kg)  08/06/18 178 lb (80.7 kg)  04/23/18 180 lb 3.2 oz (81.7 kg)     Lab Results  Component Value Date   WBC 8.3 08/22/2018   HGB 12.5 08/22/2018   HCT 38.5 08/22/2018   PLT 193.0 08/22/2018   GLUCOSE 92 08/06/2018   CHOL 178 08/06/2018   TRIG 80.0 08/06/2018   HDL 63.60 08/06/2018   LDLDIRECT 192.0 02/22/2017   LDLCALC 98 08/06/2018   ALT  20 08/06/2018   AST 21 08/06/2018   NA 141 08/06/2018   K 4.1 08/06/2018   CL 103 08/06/2018   CREATININE 0.90 08/06/2018   BUN 13 08/06/2018   CO2 32 08/06/2018   TSH 1.39 02/22/2017   INR 1.1 (H) 04/23/2018   INR 1.1 04/23/2018    Ct Head Wo Contrast  Result Date: 03/06/2017 CLINICAL DATA:  Chronic headache for approximately 1 month EXAM: CT HEAD WITHOUT CONTRAST TECHNIQUE: Contiguous axial images were obtained from the base of the skull through the vertex without intravenous contrast. COMPARISON:  None. FINDINGS: Brain: The ventricles are normal in size and configuration. There is invagination of CSF into the sella consistent with a degree of empty sella. There is no intracranial mass,  hemorrhage, extra-axial fluid collection, or midline shift. Gray-white compartments appear normal. No acute infarct evident. Vascular: No hyperdense vessel. No appreciable vascular calcification. Skull: Bony calvarium appears intact. Sinuses/Orbits: There is mucosal thickening in several ethmoid air cells bilaterally. Other visualized paranasal sinuses are clear. Visualized orbits appear symmetric bilaterally. Other: Mastoid air cells are clear. IMPRESSION: 1. There is a degree of empty sella. Significance of this finding uncertain. 2. No intracranial mass, hemorrhage, or extra-axial fluid collection. Gray-white compartments appear normal. 3. There are foci of mucosal thickening in several ethmoid air cells. Electronically Signed   By: Lowella Grip III M.D.   On: 03/06/2017 10:25     Assessment & Plan:  Plan  I have discontinued Olivianna Bonebrake's (Flaxseed, Linseed, (FLAX SEEDS PO)), Red Yeast Rice Extract (RED YEAST RICE PO), and amoxicillin-clavulanate. I am also having her maintain her atorvastatin and metoprolol succinate.  No orders of the defined types were placed in this encounter.   Problem List Items Addressed This Visit    None    Visit Diagnoses    Pain in neck    -  Primary   Relevant Orders   CBC with Differential/Platelet (Completed)   US SOFT TISSUE HEAD & NECK (NON-THYROID) (Completed)       If no improvement consider ENT  Follow-up: Return if symptoms worsen or fail to improve.  Ann Held, DO

## 2018-08-23 LAB — CBC WITH DIFFERENTIAL/PLATELET
Basophils Absolute: 0.1 10*3/uL (ref 0.0–0.1)
Basophils Relative: 1.1 % (ref 0.0–3.0)
Eosinophils Absolute: 0.3 10*3/uL (ref 0.0–0.7)
Eosinophils Relative: 3.7 % (ref 0.0–5.0)
HCT: 38.5 % (ref 36.0–46.0)
Hemoglobin: 12.5 g/dL (ref 12.0–15.0)
Lymphocytes Relative: 32.2 % (ref 12.0–46.0)
Lymphs Abs: 2.7 10*3/uL (ref 0.7–4.0)
MCHC: 32.4 g/dL (ref 30.0–36.0)
MCV: 71.4 fl — ABNORMAL LOW (ref 78.0–100.0)
Monocytes Absolute: 0.4 10*3/uL (ref 0.1–1.0)
Monocytes Relative: 5.3 % (ref 3.0–12.0)
Neutro Abs: 4.8 10*3/uL (ref 1.4–7.7)
Neutrophils Relative %: 57.7 % (ref 43.0–77.0)
PLATELETS: 193 10*3/uL (ref 150.0–400.0)
RBC: 5.4 Mil/uL — ABNORMAL HIGH (ref 3.87–5.11)
RDW: 15 % (ref 11.5–15.5)
WBC: 8.3 10*3/uL (ref 4.0–10.5)

## 2018-11-06 ENCOUNTER — Other Ambulatory Visit: Payer: Self-pay | Admitting: Family Medicine

## 2018-11-06 DIAGNOSIS — I10 Essential (primary) hypertension: Secondary | ICD-10-CM

## 2018-11-25 ENCOUNTER — Ambulatory Visit: Payer: Self-pay | Admitting: Family Medicine

## 2018-12-30 ENCOUNTER — Ambulatory Visit: Payer: Self-pay | Admitting: Family Medicine

## 2018-12-30 DIAGNOSIS — Z0289 Encounter for other administrative examinations: Secondary | ICD-10-CM

## 2019-02-05 ENCOUNTER — Other Ambulatory Visit: Payer: Self-pay | Admitting: Family Medicine

## 2019-02-05 DIAGNOSIS — I10 Essential (primary) hypertension: Secondary | ICD-10-CM

## 2019-02-14 ENCOUNTER — Ambulatory Visit: Payer: Self-pay | Admitting: *Deleted

## 2019-02-14 NOTE — Telephone Encounter (Signed)
Pt called with complaints of "a funny feeling in body", "concrete in body" floaters in both eyes, headaches, and chest tightness at night only; she is not sure if it is related her medication because she takes them at night; recommendations made per nurse triage protocol; the pt verbalized understanding; the pt sees Dr Etter Sjogren, [place]; spoke with [facility], and she will route this information to Dr Etter Sjogren and call the pt back; the pt can be contacted at [phone]; a message can be left on the voicemail.  Reason for Disposition . [1] Chest pain(s) lasting a few seconds AND [2] persists > 3 days  Answer Assessment - Initial Assessment Questions 1. LOCATION: "Where does it hurt?"      Left side 2. RADIATION: "Does the pain go anywhere else?" (e.g., into neck, jaw, arms, back)    no 3. ONSET: "When did the chest pain begin?" (Minutes, hours or days)      1 month ago 4. PATTERN "Does the pain come and go, or has it been constant since it started?"  "Does it get worse with exertion?"      intermittent 5. DURATION: "How long does it last" (e.g., seconds, minutes, hours)    seconds 6. SEVERITY: "How bad is the pain?"  (e.g., Scale 1-10; mild, moderate, or severe)    - MILD (1-3): doesn't interfere with normal activities     - MODERATE (4-7): interferes with normal activities or awakens from sleep    - SEVERE (8-10): excruciating pain, unable to do any normal activities      6 out of 10 7. CARDIAC RISK FACTORS: "Do you have any history of heart problems or risk factors for heart disease?" (e.g., angina, prior heart attack; diabetes, high blood pressure, high cholesterol, smoker, or strong family history of heart disease) High blood pressure and high cholesterol  8. PULMONARY RISK FACTORS: "Do you have any history of lung disease?"  (e.g., blood clots in lung, asthma, emphysema, birth control pills)   No; family hx of blood clots 9. CAUSE: "What do you think is causing the chest pain?"     Not  sure;maybe medication 10. OTHER SYMPTOMS: "Do you have any other symptoms?" (e.g., dizziness, nausea, vomiting, sweating, fever, difficulty breathing, cough)      Floaters in eyes, muscle heaviness 11. PREGNANCY: "Is there any chance you are pregnant?" "When was your last menstrual period?"       No, no  Protocols used: CHEST PAIN-A-AH

## 2019-02-14 NOTE — Telephone Encounter (Signed)
Princess was going to talk to the pt see if she needed ER or virtual visit

## 2019-02-18 ENCOUNTER — Emergency Department (HOSPITAL_BASED_OUTPATIENT_CLINIC_OR_DEPARTMENT_OTHER): Payer: Self-pay

## 2019-02-18 ENCOUNTER — Encounter (HOSPITAL_BASED_OUTPATIENT_CLINIC_OR_DEPARTMENT_OTHER): Payer: Self-pay | Admitting: *Deleted

## 2019-02-18 ENCOUNTER — Emergency Department (HOSPITAL_BASED_OUTPATIENT_CLINIC_OR_DEPARTMENT_OTHER)
Admission: EM | Admit: 2019-02-18 | Discharge: 2019-02-18 | Disposition: A | Discharge status: Home / Self Care | Payer: Self-pay | Attending: Emergency Medicine | Admitting: Emergency Medicine

## 2019-02-18 ENCOUNTER — Ambulatory Visit: Payer: Self-pay | Admitting: Family Medicine

## 2019-02-18 ENCOUNTER — Other Ambulatory Visit: Payer: Self-pay

## 2019-02-18 DIAGNOSIS — R51 Headache: Secondary | ICD-10-CM | POA: Insufficient documentation

## 2019-02-18 DIAGNOSIS — R42 Dizziness and giddiness: Secondary | ICD-10-CM | POA: Insufficient documentation

## 2019-02-18 DIAGNOSIS — Z79899 Other long term (current) drug therapy: Secondary | ICD-10-CM | POA: Insufficient documentation

## 2019-02-18 DIAGNOSIS — I10 Essential (primary) hypertension: Secondary | ICD-10-CM | POA: Insufficient documentation

## 2019-02-18 HISTORY — DX: Essential (primary) hypertension: I10

## 2019-02-18 LAB — COMPREHENSIVE METABOLIC PANEL
ALT: 21 U/L (ref 0–44)
AST: 24 U/L (ref 15–41)
Albumin: 4.5 g/dL (ref 3.5–5.0)
Alkaline Phosphatase: 78 U/L (ref 38–126)
Anion gap: 7 (ref 5–15)
BUN: 12 mg/dL (ref 6–20)
CO2: 30 mmol/L (ref 22–32)
Calcium: 9.9 mg/dL (ref 8.9–10.3)
Chloride: 100 mmol/L (ref 98–111)
Creatinine, Ser: 0.89 mg/dL (ref 0.44–1.00)
GFR calc Af Amer: >60 mL/min (ref >60)
GFR calc non Af Amer: >60 mL/min (ref >60)
Glucose, Bld: 93 mg/dL (ref 70–99)
Potassium: 3.8 mmol/L (ref 3.5–5.1)
Sodium: 137 mmol/L (ref 135–145)
Total Bilirubin: 0.8 mg/dL (ref 0.3–1.2)
Total Protein: 8.1 g/dL (ref 6.5–8.1)

## 2019-02-18 LAB — CBC WITH DIFFERENTIAL/PLATELET
Abs Immature Granulocytes: 0.01 10*3/uL (ref 0.00–0.07)
Basophils Absolute: 0.1 10*3/uL (ref 0.0–0.1)
Basophils Relative: 1 %
Eosinophils Absolute: 0.3 10*3/uL (ref 0.0–0.5)
Eosinophils Relative: 4 %
HCT: 42.1 % (ref 36.0–46.0)
Hemoglobin: 12.7 g/dL (ref 12.0–15.0)
Immature Granulocytes: 0 %
Lymphocytes Relative: 31 %
Lymphs Abs: 2.3 10*3/uL (ref 0.7–4.0)
MCH: 22.9 pg — ABNORMAL LOW (ref 26.0–34.0)
MCHC: 30.2 g/dL (ref 30.0–36.0)
MCV: 76 fL — ABNORMAL LOW (ref 80.0–100.0)
Monocytes Absolute: 0.5 10*3/uL (ref 0.1–1.0)
Monocytes Relative: 7 %
Neutro Abs: 4.4 10*3/uL (ref 1.7–7.7)
Neutrophils Relative %: 57 %
Platelets: 210 10*3/uL (ref 150–400)
RBC: 5.54 MIL/uL — ABNORMAL HIGH (ref 3.87–5.11)
RDW: 15.1 % (ref 11.5–15.5)
WBC: 7.5 10*3/uL (ref 4.0–10.5)
nRBC: 0 % (ref 0.0–0.2)

## 2019-02-18 LAB — TROPONIN I (HIGH SENSITIVITY): Troponin I (High Sensitivity): 3 ng/L (ref <18)

## 2019-02-18 MED ORDER — LOSARTAN POTASSIUM 50 MG PO TABS: 50.0000 mg | ORAL_TABLET | Freq: Every day | ORAL | 1 refills | Status: DC

## 2019-02-18 MED FILL — LOSARTAN POTASSIUM 50 MG TA: 50 | 30 days supply | Qty: 30 | Fill #0

## 2019-02-18 NOTE — ED Provider Notes (Signed)
[facility] EMERGENCY DEPARTMENT Provider Note   CSN: [id number] Arrival date & time: 02/18/19  1158     History   Chief Complaint Chief Complaint  Patient presents with  . Hypertension  . Dizziness    HPI Stacy Bond is a 58 y.o. female.     Patient is a 58 year old female with a history of allergies, hyperlipidemia and hypertension who is presenting today with several complaints.  Patient states for the last week she has had intermittent sensation of just not feeling well.  She has had intermittent headache and floaters in her vision and a general sense of being unwell.  She denies any shortness of breath or localized chest pain.  No cough, fever, congestion.  Patient states that she did check her blood pressure and it has been elevated in the last week.  She denies any recent diet changes, new herbal supplements or over-the-counter medications.  She denies any significant weight gain recently.  Patient was started on metoprolol in March after being told by the dentist that her blood pressure was elevated.  She has been taking it regularly.  She has taken a dose today.  She has no unilateral weakness, numbness, speech or gait difficulties.  The history is provided by the patient.  Hypertension This is a recurrent problem.  Dizziness   Past Medical History:  Diagnosis Date  . Anemia   . Fibroid   . Hemorrhoids   . Hypertension     Patient Active Problem List   Diagnosis Date Noted  . Essential hypertension 08/06/2018  . Tooth abscess 08/06/2018  . Hyperlipidemia 09/01/2017  . Slow transit constipation 08/31/2017  . Fibroid 03/14/2017  . Chest pain 04/15/2013  . Chronic serous otitis media 03/20/2011  . Seasonal allergies 03/16/2011    History reviewed. No pertinent surgical history.   OB History    Gravida  1   Para  1   Term      Preterm      AB      Living        SAB      TAB      Ectopic      Multiple      Live Births  1             Home Medications    Prior to Admission medications   Medication Sig Start Date End Date Taking? Authorizing Provider  atorvastatin (LIPITOR) 20 MG tablet Take 1 tablet (20 mg total) by mouth daily. 05/14/18  Yes Roma Schanz R, DO  metoprolol succinate (TOPROL-XL) 25 MG 24 hr tablet TAKE ONE TABLET BY MOUTH ONE TIME DAILY 02/06/19  Yes Roma Schanz R, DO  cefdinir (OMNICEF) 300 MG capsule Take 1 capsule (300 mg total) by mouth 2 (two) times daily. 08/22/18   Ann Held, DO    Family History Family History  Problem Relation Age of Onset  . Diabetes Father   . Hypertension Mother   . Pulmonary embolism Mother   . Pulmonary embolism Brother   . Clotting disorder Daughter     Social History Social History   Tobacco Use  . Smoking status: Never Smoker  . Smokeless tobacco: Never Used  Substance Use Topics  . Alcohol use: Yes    Alcohol/week: 2.0 standard drinks    Types: 2 Glasses of wine per week  . Drug use: No     Allergies   Patient has no known allergies.   Review  of Systems Review of Systems  Neurological: Positive for dizziness.  All other systems reviewed and are negative.    Physical Exam Updated Vital Signs BP (!) 166/100 (BP Location: Left Arm)   Pulse 78   Temp 98.9 F (37.2 C) (Oral)   Resp 16   Ht 5\' 5"  (1.651 m)   Wt 81.2 kg   SpO2 100%   BMI 29.79 kg/m   Physical Exam Vitals signs and nursing note reviewed.  Constitutional:      General: She is not in acute distress.    Appearance: She is well-developed and normal weight.  HENT:     Head: Normocephalic and atraumatic.     Right Ear: Tympanic membrane normal.     Left Ear: Tympanic membrane normal.  Eyes:     Pupils: Pupils are equal, round, and reactive to light.  Cardiovascular:     Rate and Rhythm: Normal rate and regular rhythm.     Pulses: Normal pulses.     Heart sounds: Normal heart sounds. No murmur. No friction rub.  Pulmonary:     Effort:  Pulmonary effort is normal.     Breath sounds: Normal breath sounds. No wheezing or rales.  Abdominal:     General: Bowel sounds are normal. There is no distension.     Palpations: Abdomen is soft.     Tenderness: There is no abdominal tenderness. There is no guarding or rebound.  Musculoskeletal: Normal range of motion.        General: No tenderness.     Comments: No edema  Skin:    General: Skin is warm and dry.     Findings: No rash.  Neurological:     General: No focal deficit present.     Mental Status: She is alert and oriented to person, place, and time. Mental status is at baseline.     Cranial Nerves: No cranial nerve deficit.  Psychiatric:        Mood and Affect: Mood normal.        Behavior: Behavior normal.        Thought Content: Thought content normal.      ED Treatments / Results  Labs (all labs ordered are listed, but only abnormal results are displayed) Labs Reviewed  CBC WITH DIFFERENTIAL/PLATELET - Abnormal; Notable for the following components:      Result Value   RBC 5.54 (*)    MCV 76.0 (*)    MCH 22.9 (*)    All other components within normal limits  COMPREHENSIVE METABOLIC PANEL  TROPONIN I (HIGH SENSITIVITY)  TROPONIN I (HIGH SENSITIVITY)    EKG EKG Interpretation  Date/Time:  Tuesday February 18 2019 12:17:06 EDT Ventricular Rate:  71 PR Interval:    QRS Duration: 82 QT Interval:  401 QTC Calculation: 436 R Axis:   28 Text Interpretation:  Sinus rhythm Consider left atrial enlargement Borderline T abnormalities, anterior leads No significant change since last tracing Confirmed by Stacy Bond [id number]) on 02/18/2019 12:22:14 PM   Radiology Dg Chest Port 1 View  Result Date: 02/18/2019 CLINICAL DATA:  Headache, hypertension, chest pain EXAM: PORTABLE CHEST 1 VIEW COMPARISON:  07/06/2016 FINDINGS: The heart size and mediastinal contours are within normal limits. Both lungs are clear. The visualized skeletal structures are unremarkable.  IMPRESSION: No acute abnormality of the lungs in AP portable projection. Electronically Signed   By: Eddie Candle M.D.   On: 02/18/2019 13:11    Procedures Procedures (including critical care time)  Medications  Ordered in ED Medications - No data to display   Initial Impression / Assessment and Plan / ED Course  I have reviewed the triage vital signs and the nursing notes.  Pertinent labs & imaging results that were available during my care of the patient were reviewed by me and considered in my medical decision making (see chart for details).        Pleasant 58 year old female presenting today with seeing floaters in her vision, general feeling of being unwell and elevated blood pressure.  Seems that patient has been experiencing this intermittently over the last week but has had the symptoms in the past prior to starting blood pressure medication.  Patient continues to take metoprolol 25 mg but when she is checked her blood pressure at home it has been in the 150s to 160s over 100s.  Patient was going to see her doctor today but preferred to see someone in person instead of virtual appointment.  She denies any chest pain or shortness of breath at this time.  Patient's labs are consistent with a persistent microcytic anemia but normal renal function, negative troponin and unchanged EKG. 3:07 PM Recurrent blood pressures range between 140s to 150s over 90s to 100s.  Spoke with Dr. Etter Sjogren the patient's PCP and she recommended starting losartan 50 mg and continuing the 25 mg of metoprolol.  Findings discussed with the patient and her husband.  She will start this medication and follow-up within the next 1 to 2 weeks.  Patient in the past had never had renal ultrasound or other investigation for potential of why she has hypertension.  Question if she possibly needs this test?  She does have normal renal function today.  Final Clinical Impressions(s) / ED Diagnoses   Final diagnoses:   Hypertension, unspecified type    ED Discharge Orders         Ordered    losartan (COZAAR) 50 MG tablet  Daily     02/18/19 1506           Stacy Dessert, MD 02/18/19 1508

## 2019-02-18 NOTE — Discharge Instructions (Signed)
Continue to monitor your blood pressure and record the readings at home.  Start taking the losartan in addition to the other medications that you are on

## 2019-02-18 NOTE — ED Triage Notes (Signed)
She took her BP this am due to lightheadedness. Muscle weakness. Floaters in her eyes.

## 2019-02-19 NOTE — Telephone Encounter (Signed)
Per chart pt was seen on 02/18/19 at the ED

## 2019-02-28 ENCOUNTER — Ambulatory Visit: Payer: Self-pay | Admitting: Family Medicine

## 2019-03-04 ENCOUNTER — Ambulatory Visit: Payer: Self-pay | Admitting: Family Medicine

## 2019-03-07 ENCOUNTER — Ambulatory Visit (INDEPENDENT_AMBULATORY_CARE_PROVIDER_SITE_OTHER): Payer: Self-pay | Admitting: Family Medicine

## 2019-03-07 ENCOUNTER — Other Ambulatory Visit: Payer: Self-pay

## 2019-03-07 ENCOUNTER — Encounter: Payer: Self-pay | Admitting: Family Medicine

## 2019-03-07 VITALS — BP 140/84 | HR 79 | Temp 97.6°F | Resp 18 | Ht 65.0 in | Wt 184.0 lb

## 2019-03-07 DIAGNOSIS — E785 Hyperlipidemia, unspecified: Secondary | ICD-10-CM

## 2019-03-07 DIAGNOSIS — I10 Essential (primary) hypertension: Secondary | ICD-10-CM

## 2019-03-07 MED ORDER — METOPROLOL SUCCINATE ER 25 MG PO TB24: 25.0000 mg | ORAL_TABLET | Freq: Every day | ORAL | 2 refills | Status: DC

## 2019-03-07 MED ORDER — LOSARTAN POTASSIUM 50 MG PO TABS: 50.0000 mg | ORAL_TABLET | Freq: Every day | ORAL | 1 refills | Status: DC

## 2019-03-07 NOTE — Patient Instructions (Signed)

## 2019-03-07 NOTE — Progress Notes (Signed)
Patient ID: Stacy Bond, female    DOB: 01/28/1961  Age: 58 y.o. MRN: [id number]    Subjective:  Subjective  HPI Stacy Bond presents for bp f/u / er  Pt with no complaints Er visit reviewed Pt feels much better   Review of Systems  Constitutional: Negative for appetite change, diaphoresis, fatigue and unexpected weight change.  Eyes: Negative for pain, redness and visual disturbance.  Respiratory: Negative for cough, chest tightness, shortness of breath and wheezing.   Cardiovascular: Negative for chest pain, palpitations and leg swelling.  Endocrine: Negative for cold intolerance, heat intolerance, polydipsia, polyphagia and polyuria.  Genitourinary: Negative for difficulty urinating, dysuria and frequency.  Neurological: Negative for dizziness, light-headedness, numbness and headaches.    History Past Medical History:  Diagnosis Date  . Anemia   . Fibroid   . Hemorrhoids   . Hypertension     She has no past surgical history on file.   Her family history includes Clotting disorder in her daughter; Diabetes in her father; Hypertension in her mother; Pulmonary embolism in her brother and mother.She reports that she has never smoked. She has never used smokeless tobacco. She reports current alcohol use of about 2.0 standard drinks of alcohol per week. She reports that she does not use drugs.  Current Outpatient Medications on File Prior to Visit  Medication Sig Dispense Refill  . atorvastatin (LIPITOR) 20 MG tablet Take 1 tablet (20 mg total) by mouth daily. 90 tablet 3   No current facility-administered medications on file prior to visit.      Objective:  Objective  Physical Exam Vitals signs and nursing note reviewed.  Constitutional:      Appearance: She is well-developed.  HENT:     Head: Normocephalic and atraumatic.  Eyes:     Conjunctiva/sclera: Conjunctivae normal.  Neck:     Musculoskeletal: Normal range of motion and neck supple.     Thyroid: No  thyromegaly.     Vascular: No carotid bruit or JVD.  Cardiovascular:     Rate and Rhythm: Normal rate and regular rhythm.     Heart sounds: Normal heart sounds. No murmur.  Pulmonary:     Effort: Pulmonary effort is normal. No respiratory distress.     Breath sounds: Normal breath sounds. No wheezing or rales.  Chest:     Chest wall: No tenderness.  Neurological:     Mental Status: She is alert and oriented to person, place, and time.    BP 140/84 (BP Location: Right Arm, Patient Position: Sitting, Cuff Size: Normal)   Pulse 79   Temp 97.6 F (36.4 C) (Temporal)   Resp 18   Ht 5\' 5"  (1.651 m)   Wt 184 lb (83.5 kg)   SpO2 98%   BMI 30.62 kg/m  Wt Readings from Last 3 Encounters:  03/07/19 184 lb (83.5 kg)  02/18/19 179 lb (81.2 kg)  08/22/18 180 lb 3.2 oz (81.7 kg)     Lab Results  Component Value Date   WBC 7.5 02/18/2019   HGB 12.7 02/18/2019   HCT 42.1 02/18/2019   PLT 210 02/18/2019   GLUCOSE 93 02/18/2019   CHOL 178 08/06/2018   TRIG 80.0 08/06/2018   HDL 63.60 08/06/2018   LDLDIRECT 192.0 02/22/2017   LDLCALC 98 08/06/2018   ALT 21 02/18/2019   AST 24 02/18/2019   NA 137 02/18/2019   K 3.8 02/18/2019   CL 100 02/18/2019   CREATININE 0.89 02/18/2019   BUN 12 02/18/2019  CO2 30 02/18/2019   TSH 1.39 02/22/2017   INR 1.1 (H) 04/23/2018   INR 1.1 04/23/2018    Dg Chest Port 1 View  Result Date: 02/18/2019 CLINICAL DATA:  Headache, hypertension, chest pain EXAM: PORTABLE CHEST 1 VIEW COMPARISON:  07/06/2016 FINDINGS: The heart size and mediastinal contours are within normal limits. Both lungs are clear. The visualized skeletal structures are unremarkable. IMPRESSION: No acute abnormality of the lungs in AP portable projection. Electronically Signed   By: Eddie Candle M.D.   On: 02/18/2019 13:11     Assessment & Plan:  Plan  I have discontinued Stacy Bond's cefdinir. I have also changed her metoprolol succinate. Additionally, I am having her maintain  her atorvastatin and losartan.  Meds ordered this encounter  Medications  . metoprolol succinate (TOPROL-XL) 25 MG 24 hr tablet    Sig: Take 1 tablet (25 mg total) by mouth daily.    Dispense:  90 tablet    Refill:  2  . losartan (COZAAR) 50 MG tablet    Sig: Take 1 tablet (50 mg total) by mouth daily.    Dispense:  90 tablet    Refill:  1    Problem List Items Addressed This Visit      Unprioritized   Essential hypertension    Well controlled, no changes to meds. Encouraged heart healthy diet such as the DASH diet and exercise as tolerated.  bp are even better at home       Relevant Medications   metoprolol succinate (TOPROL-XL) 25 MG 24 hr tablet   losartan (COZAAR) 50 MG tablet   Other Relevant Orders   Lipid panel   Comprehensive metabolic panel   Hyperlipidemia - Primary    Tolerating statin, encouraged heart healthy diet, avoid trans fats, minimize simple carbs and saturated fats. Increase exercise as tolerated      Relevant Medications   metoprolol succinate (TOPROL-XL) 25 MG 24 hr tablet   losartan (COZAAR) 50 MG tablet   Other Relevant Orders   Lipid panel   Comprehensive metabolic panel      Follow-up: Return in about 3 months (around 06/06/2019), or if symptoms worsen or fail to improve, for hypertension.  Ann Held, DO

## 2019-03-07 NOTE — Assessment & Plan Note (Signed)
Well controlled, no changes to meds. Encouraged heart healthy diet such as the DASH diet and exercise as tolerated.  bp are even better at home

## 2019-03-07 NOTE — Assessment & Plan Note (Signed)
Tolerating statin, encouraged heart healthy diet, avoid trans fats, minimize simple carbs and saturated fats. Increase exercise as tolerated 

## 2019-03-08 LAB — COMPREHENSIVE METABOLIC PANEL
AG Ratio: 1.6 (calc) (ref 1.0–2.5)
ALT: 19 U/L (ref 6–29)
AST: 24 U/L (ref 10–35)
Albumin: 4.5 g/dL (ref 3.6–5.1)
Alkaline phosphatase (APISO): 81 U/L (ref 37–153)
BUN: 11 mg/dL (ref 7–25)
CO2: 28 mmol/L (ref 20–32)
Calcium: 9.7 mg/dL (ref 8.6–10.4)
Chloride: 105 mmol/L (ref 98–110)
Creat: 0.88 mg/dL (ref 0.50–1.05)
Globulin: 2.8 g/dL (calc) (ref 1.9–3.7)
Glucose, Bld: 89 mg/dL (ref 65–99)
Potassium: 4.3 mmol/L (ref 3.5–5.3)
Sodium: 144 mmol/L (ref 135–146)
Total Bilirubin: 0.6 mg/dL (ref 0.2–1.2)
Total Protein: 7.3 g/dL (ref 6.1–8.1)

## 2019-03-08 LAB — LIPID PANEL
Cholesterol: 191 mg/dL (ref <200)
HDL: 57 mg/dL (ref >=50)
LDL Cholesterol (Calc): 113 mg/dL (calc) — ABNORMAL HIGH
Non-HDL Cholesterol (Calc): 134 mg/dL (calc) — ABNORMAL HIGH (ref <130)
Total CHOL/HDL Ratio: 3.4 (calc) (ref <5.0)
Triglycerides: 106 mg/dL (ref <150)

## 2019-03-09 ENCOUNTER — Other Ambulatory Visit: Payer: Self-pay | Admitting: Family Medicine

## 2019-03-09 DIAGNOSIS — E785 Hyperlipidemia, unspecified: Secondary | ICD-10-CM

## 2019-05-04 ENCOUNTER — Other Ambulatory Visit: Payer: Self-pay | Admitting: Family Medicine

## 2019-05-04 DIAGNOSIS — E785 Hyperlipidemia, unspecified: Secondary | ICD-10-CM

## 2019-06-09 ENCOUNTER — Ambulatory Visit: Payer: Self-pay | Admitting: Family Medicine

## 2019-07-02 ENCOUNTER — Other Ambulatory Visit: Payer: Self-pay

## 2019-07-03 ENCOUNTER — Ambulatory Visit (INDEPENDENT_AMBULATORY_CARE_PROVIDER_SITE_OTHER): Payer: Self-pay | Admitting: Family Medicine

## 2019-07-03 ENCOUNTER — Encounter: Payer: Self-pay | Admitting: Family Medicine

## 2019-07-03 VITALS — BP 130/90 | HR 77 | Temp 97.7°F | Resp 18 | Ht 65.0 in | Wt 184.6 lb

## 2019-07-03 DIAGNOSIS — E785 Hyperlipidemia, unspecified: Secondary | ICD-10-CM

## 2019-07-03 DIAGNOSIS — I10 Essential (primary) hypertension: Secondary | ICD-10-CM

## 2019-07-03 LAB — LIPID PANEL
Cholesterol: 171 mg/dL (ref 0–200)
HDL: 55.6 mg/dL (ref >39.00)
LDL Cholesterol: 100 mg/dL — ABNORMAL HIGH (ref 0–99)
NonHDL: 115.32
Total CHOL/HDL Ratio: 3
Triglycerides: 76 mg/dL (ref 0.0–149.0)
VLDL: 15.2 mg/dL (ref 0.0–40.0)

## 2019-07-03 LAB — COMPREHENSIVE METABOLIC PANEL
ALT: 18 U/L (ref 0–35)
AST: 21 U/L (ref 0–37)
Albumin: 4.5 g/dL (ref 3.5–5.2)
Alkaline Phosphatase: 85 U/L (ref 39–117)
BUN: 14 mg/dL (ref 6–23)
CO2: 33 mEq/L — ABNORMAL HIGH (ref 19–32)
Calcium: 9.8 mg/dL (ref 8.4–10.5)
Chloride: 103 mEq/L (ref 96–112)
Creatinine, Ser: 0.94 mg/dL (ref 0.40–1.20)
GFR: 74.21 mL/min (ref >60.00)
Glucose, Bld: 92 mg/dL (ref 70–99)
Potassium: 4 mEq/L (ref 3.5–5.1)
Sodium: 140 mEq/L (ref 135–145)
Total Bilirubin: 0.8 mg/dL (ref 0.2–1.2)
Total Protein: 7.5 g/dL (ref 6.0–8.3)

## 2019-07-03 NOTE — Assessment & Plan Note (Signed)
Encouraged heart healthy diet, increase exercise, avoid trans fats, consider a krill oil cap daily 

## 2019-07-03 NOTE — Progress Notes (Signed)
Patient ID: Stacy Bond, female    DOB: [birth year]  Age: 59 y.o. MRN: [id number]    Subjective:  Subjective  HPI Stacy Bond presents for f/u bp and chol.   Her bp at home is normally 123/82 -----   No complaints   Review of Systems  Constitutional: Negative for appetite change, diaphoresis, fatigue and unexpected weight change.  Eyes: Negative for pain, redness and visual disturbance.  Respiratory: Negative for cough, chest tightness, shortness of breath and wheezing.   Cardiovascular: Negative for chest pain, palpitations and leg swelling.  Endocrine: Negative for cold intolerance, heat intolerance, polydipsia, polyphagia and polyuria.  Genitourinary: Negative for difficulty urinating, dysuria and frequency.  Neurological: Negative for dizziness, light-headedness, numbness and headaches.    History Past Medical History:  Diagnosis Date  . Anemia   . Fibroid   . Hemorrhoids   . Hypertension     She has no past surgical history on file.   Her family history includes Clotting disorder in her daughter; Diabetes in her father; Hypertension in her mother; Pulmonary embolism in her brother and mother.She reports that she has never smoked. She has never used smokeless tobacco. She reports current alcohol use of about 2.0 standard drinks of alcohol per week. She reports that she does not use drugs.  Current Outpatient Medications on File Prior to Visit  Medication Sig Dispense Refill  . atorvastatin (LIPITOR) 20 MG tablet TAKE ONE TABLET BY MOUTH ONE TIME DAILY 90 tablet 3  . losartan (COZAAR) 50 MG tablet Take 1 tablet (50 mg total) by mouth daily. 90 tablet 1  . metoprolol succinate (TOPROL-XL) 25 MG 24 hr tablet Take 1 tablet (25 mg total) by mouth daily. 90 tablet 2   No current facility-administered medications on file prior to visit.     Objective:  Objective  Physical Exam Vitals and nursing note reviewed.  Constitutional:      Appearance: She is well-developed.  HENT:      Head: Normocephalic and atraumatic.  Eyes:     Conjunctiva/sclera: Conjunctivae normal.  Neck:     Thyroid: No thyromegaly.     Vascular: No carotid bruit or JVD.  Cardiovascular:     Rate and Rhythm: Normal rate and regular rhythm.     Heart sounds: Normal heart sounds. No murmur.  Pulmonary:     Effort: Pulmonary effort is normal. No respiratory distress.     Breath sounds: Normal breath sounds. No wheezing or rales.  Chest:     Chest wall: No tenderness.  Musculoskeletal:     Cervical back: Normal range of motion and neck supple.  Neurological:     Mental Status: She is alert and oriented to person, place, and time.    BP 130/90 (BP Location: Right Arm, Patient Position: Sitting, Cuff Size: Normal)   Pulse 77   Temp 97.7 F (36.5 C) (Temporal)   Resp 18   Ht 5\' 5"  (1.651 m)   Wt 184 lb 9.6 oz (83.7 kg)   SpO2 97%   BMI 30.72 kg/m  Wt Readings from Last 3 Encounters:  07/03/19 184 lb 9.6 oz (83.7 kg)  03/07/19 184 lb (83.5 kg)  02/18/19 179 lb (81.2 kg)     Lab Results  Component Value Date   WBC 7.5 02/18/2019   HGB 12.7 02/18/2019   HCT 42.1 02/18/2019   PLT 210 02/18/2019   GLUCOSE 89 03/07/2019   CHOL 191 03/07/2019   TRIG 106 03/07/2019   HDL 57 03/07/2019  LDLDIRECT 192.0 02/22/2017   LDLCALC 113 (H) 03/07/2019   ALT 19 03/07/2019   AST 24 03/07/2019   NA 144 03/07/2019   K 4.3 03/07/2019   CL 105 03/07/2019   CREATININE 0.88 03/07/2019   BUN 11 03/07/2019   CO2 28 03/07/2019   TSH 1.39 02/22/2017   INR 1.1 (H) 04/23/2018   INR 1.1 04/23/2018    DG Chest Port 1 View  Result Date: 02/18/2019 CLINICAL DATA:  Headache, hypertension, chest pain EXAM: PORTABLE CHEST 1 VIEW COMPARISON:  07/06/2016 FINDINGS: The heart size and mediastinal contours are within normal limits. Both lungs are clear. The visualized skeletal structures are unremarkable. IMPRESSION: No acute abnormality of the lungs in AP portable projection. Electronically Signed   By:  Eddie Candle M.D.   On: 02/18/2019 13:11     Assessment & Plan:  Plan  I am having Stacy Bond maintain her metoprolol succinate, losartan, and atorvastatin.  No orders of the defined types were placed in this encounter.   Problem List Items Addressed This Visit      Unprioritized   Essential hypertension - Primary    .Well controlled, no changes to meds. Encouraged heart healthy diet such as the DASH diet and exercise as tolerated.       Relevant Orders   Lipid panel   Comprehensive metabolic panel   Hyperlipidemia    Encouraged heart healthy diet, increase exercise, avoid trans fats, consider a krill oil cap daily      Relevant Orders   Lipid panel   Comprehensive metabolic panel    Other Visit Diagnoses    Morbid obesity ([place])       Relevant Orders   Amb Ref to Medical Weight Management      Follow-up: Return in about 6 months (around 12/31/2019), or if symptoms worsen or fail to improve, for annual exam, fasting.  Ann Held, DO

## 2019-07-03 NOTE — Assessment & Plan Note (Signed)
Well controlled, no changes to meds. Encouraged heart healthy diet such as the DASH diet and exercise as tolerated.  °

## 2019-07-03 NOTE — Patient Instructions (Addendum)
COVID-19 Vaccine Information can be found at: [email or web address] For questions related to vaccine distribution or appointments, please email [email or web address] or call [phone].    DASH Eating Plan DASH stands for "Dietary Approaches to Stop Hypertension." The DASH eating plan is a healthy eating plan that has been shown to reduce high blood pressure (hypertension). It may also reduce your risk for type 2 diabetes, heart disease, and stroke. The DASH eating plan may also help with weight loss. What are tips for following this plan?  General guidelines  Avoid eating more than 2,300 mg (milligrams) of salt (sodium) a day. If you have hypertension, you may need to reduce your sodium intake to 1,500 mg a day.  Limit alcohol intake to no more than 1 drink a day for nonpregnant women and 2 drinks a day for men. One drink equals 12 oz of beer, 5 oz of wine, or 1 oz of hard liquor.  Work with your health care provider to maintain a healthy body weight or to lose weight. Ask what an ideal weight is for you.  Get at least 30 minutes of exercise that causes your heart to beat faster (aerobic exercise) most days of the week. Activities may include walking, swimming, or biking.  Work with your health care provider or diet and nutrition specialist (dietitian) to adjust your eating plan to your individual calorie needs. Reading food labels   Check food labels for the amount of sodium per serving. Choose foods with less than 5 percent of the Daily Value of sodium. Generally, foods with less than 300 mg of sodium per serving fit into this eating plan.  To find whole grains, look for the word "whole" as the first word in the ingredient list. Shopping  Buy products labeled as "low-sodium" or "no salt added."  Buy fresh foods. Avoid canned foods and premade or frozen meals. Cooking  Avoid adding salt when cooking. Use salt-free seasonings  or herbs instead of table salt or sea salt. Check with your health care provider or pharmacist before using salt substitutes.  Do not fry foods. Cook foods using healthy methods such as baking, boiling, grilling, and broiling instead.  Cook with heart-healthy oils, such as olive, canola, soybean, or sunflower oil. Meal planning  Eat a balanced diet that includes: ? 5 or more servings of fruits and vegetables each day. At each meal, try to fill half of your plate with fruits and vegetables. ? Up to 6-8 servings of whole grains each day. ? Less than 6 oz of lean meat, poultry, or fish each day. A 3-oz serving of meat is about the same size as a deck of cards. One egg equals 1 oz. ? 2 servings of low-fat dairy each day. ? A serving of nuts, seeds, or beans 5 times each week. ? Heart-healthy fats. Healthy fats called Omega-3 fatty acids are found in foods such as flaxseeds and coldwater fish, like sardines, salmon, and mackerel.  Limit how much you eat of the following: ? Canned or prepackaged foods. ? Food that is high in trans fat, such as fried foods. ? Food that is high in saturated fat, such as fatty meat. ? Sweets, desserts, sugary drinks, and other foods with added sugar. ? Full-fat dairy products.  Do not salt foods before eating.  Try to eat at least 2 vegetarian meals each week.  Eat more home-cooked food and less restaurant, buffet, and fast food.  When eating at a restaurant, ask that your food be prepared with  less salt or no salt, if possible. What foods are recommended? The items listed may not be a complete list. Talk with your dietitian about what dietary choices are best for you. Grains Whole-grain or whole-wheat bread. Whole-grain or whole-wheat pasta. Brown rice. [place]. Bulgur. Whole-grain and low-sodium cereals. Pita bread. Low-fat, low-sodium crackers. Whole-wheat flour tortillas. Vegetables Fresh or frozen vegetables (raw, steamed, roasted, or grilled).  Low-sodium or reduced-sodium tomato and vegetable juice. Low-sodium or reduced-sodium tomato sauce and tomato paste. Low-sodium or reduced-sodium canned vegetables. Fruits All fresh, dried, or frozen fruit. Canned fruit in natural juice (without added sugar). Meat and other protein foods Skinless chicken or [place]. Ground chicken or [place]. Pork with fat trimmed off. Fish and seafood. Egg whites. Dried beans, peas, or lentils. Unsalted nuts, nut butters, and seeds. Unsalted canned beans. Lean cuts of beef with fat trimmed off. Low-sodium, lean deli meat. Dairy Low-fat (1%) or fat-free (skim) milk. Fat-free, low-fat, or reduced-fat cheeses. Nonfat, low-sodium ricotta or cottage cheese. Low-fat or nonfat yogurt. Low-fat, low-sodium cheese. Fats and oils Soft margarine without trans fats. Vegetable oil. Low-fat, reduced-fat, or light mayonnaise and salad dressings (reduced-sodium). Canola, safflower, olive, soybean, and sunflower oils. Avocado. Seasoning and other foods Herbs. Spices. Seasoning mixes without salt. Unsalted popcorn and pretzels. Fat-free sweets. What foods are not recommended? The items listed may not be a complete list. Talk with your dietitian about what dietary choices are best for you. Grains Baked goods made with fat, such as croissants, muffins, or some breads. Dry pasta or rice meal packs. Vegetables Creamed or fried vegetables. Vegetables in a cheese sauce. Regular canned vegetables (not low-sodium or reduced-sodium). Regular canned tomato sauce and paste (not low-sodium or reduced-sodium). Regular tomato and vegetable juice (not low-sodium or reduced-sodium). Angie Fava. Olives. Fruits Canned fruit in a light or heavy syrup. Fried fruit. Fruit in cream or butter sauce. Meat and other protein foods Fatty cuts of meat. Ribs. Fried meat. Berniece Salines. Sausage. Bologna and other processed lunch meats. Salami. Fatback. Hotdogs. Bratwurst. Salted nuts and seeds. Canned beans with added  salt. Canned or smoked fish. Whole eggs or egg yolks. Chicken or [place] with skin. Dairy Whole or 2% milk, cream, and half-and-half. Whole or full-fat cream cheese. Whole-fat or sweetened yogurt. Full-fat cheese. Nondairy creamers. Whipped toppings. Processed cheese and cheese spreads. Fats and oils Butter. Stick margarine. Lard. Shortening. Ghee. Bacon fat. Tropical oils, such as coconut, palm kernel, or palm oil. Seasoning and other foods Salted popcorn and pretzels. Onion salt, garlic salt, seasoned salt, table salt, and sea salt. Worcestershire sauce. Tartar sauce. Barbecue sauce. Teriyaki sauce. Soy sauce, including reduced-sodium. Steak sauce. Canned and packaged gravies. Fish sauce. Oyster sauce. Cocktail sauce. Horseradish that you find on the shelf. Ketchup. Mustard. Meat flavorings and tenderizers. Bouillon cubes. Hot sauce and Tabasco sauce. Premade or packaged marinades. Premade or packaged taco seasonings. Relishes. Regular salad dressings. Where to find more information:  National Heart, Lung, and [place]: https://wilson-eaton.com/  American Heart Association: www.heart.org Summary  The DASH eating plan is a healthy eating plan that has been shown to reduce high blood pressure (hypertension). It may also reduce your risk for type 2 diabetes, heart disease, and stroke.  With the DASH eating plan, you should limit salt (sodium) intake to 2,300 mg a day. If you have hypertension, you may need to reduce your sodium intake to 1,500 mg a day.  When on the DASH eating plan, aim to eat more fresh fruits and vegetables, whole grains, lean proteins,  low-fat dairy, and heart-healthy fats.  Work with your health care provider or diet and nutrition specialist (dietitian) to adjust your eating plan to your individual calorie needs. This information is not intended to replace advice given to you by your health care provider. Make sure you discuss any questions you have with your health care  provider. Document Revised: 05/11/2017 Document Reviewed: 05/22/2016 Elsevier Patient Education  2020 [employer].

## 2019-08-25 ENCOUNTER — Other Ambulatory Visit: Payer: Self-pay | Admitting: Family Medicine

## 2019-08-25 DIAGNOSIS — I10 Essential (primary) hypertension: Secondary | ICD-10-CM

## 2019-09-03 ENCOUNTER — Encounter: Payer: Self-pay | Admitting: Family Medicine

## 2019-09-03 ENCOUNTER — Other Ambulatory Visit: Payer: Self-pay | Admitting: Family Medicine

## 2019-09-03 MED ORDER — TRIAMCINOLONE ACETONIDE 0.1 % EX CREA: TOPICAL_CREAM | Freq: Two times a day (BID) | CUTANEOUS | 1 refills | Status: AC

## 2019-11-28 ENCOUNTER — Other Ambulatory Visit: Payer: Self-pay | Admitting: Family Medicine

## 2019-11-28 DIAGNOSIS — I10 Essential (primary) hypertension: Secondary | ICD-10-CM

## 2020-02-27 ENCOUNTER — Other Ambulatory Visit: Payer: Self-pay | Admitting: Family Medicine

## 2020-02-27 DIAGNOSIS — I10 Essential (primary) hypertension: Secondary | ICD-10-CM

## 2020-05-10 ENCOUNTER — Ambulatory Visit: Payer: Self-pay | Admitting: Family Medicine

## 2020-05-10 DIAGNOSIS — Z0289 Encounter for other administrative examinations: Secondary | ICD-10-CM

## 2020-05-16 ENCOUNTER — Other Ambulatory Visit: Payer: Self-pay | Admitting: Family Medicine

## 2020-05-16 DIAGNOSIS — E785 Hyperlipidemia, unspecified: Secondary | ICD-10-CM

## 2020-07-02 ENCOUNTER — Other Ambulatory Visit: Payer: Self-pay | Admitting: Family Medicine

## 2020-07-02 DIAGNOSIS — E785 Hyperlipidemia, unspecified: Secondary | ICD-10-CM

## 2020-07-25 ENCOUNTER — Other Ambulatory Visit: Payer: Self-pay | Admitting: Family Medicine

## 2020-07-25 DIAGNOSIS — E785 Hyperlipidemia, unspecified: Secondary | ICD-10-CM

## 2020-08-13 ENCOUNTER — Ambulatory Visit: Payer: Self-pay | Admitting: Family Medicine

## 2020-09-03 ENCOUNTER — Other Ambulatory Visit: Payer: Self-pay

## 2020-09-03 ENCOUNTER — Ambulatory Visit (INDEPENDENT_AMBULATORY_CARE_PROVIDER_SITE_OTHER): Payer: Self-pay | Admitting: Family Medicine

## 2020-09-03 ENCOUNTER — Encounter: Payer: Self-pay | Admitting: Family Medicine

## 2020-09-03 VITALS — BP 120/84 | HR 89 | Temp 97.8°F | Resp 18 | Ht 65.0 in | Wt 186.0 lb

## 2020-09-03 DIAGNOSIS — E785 Hyperlipidemia, unspecified: Secondary | ICD-10-CM

## 2020-09-03 DIAGNOSIS — I10 Essential (primary) hypertension: Secondary | ICD-10-CM

## 2020-09-03 LAB — LIPID PANEL
Cholesterol: 179 mg/dL (ref 0–200)
HDL: 56.2 mg/dL (ref >39.00)
LDL Cholesterol: 109 mg/dL — ABNORMAL HIGH (ref 0–99)
NonHDL: 123.02
Total CHOL/HDL Ratio: 3
Triglycerides: 70 mg/dL (ref 0.0–149.0)
VLDL: 14 mg/dL (ref 0.0–40.0)

## 2020-09-03 LAB — COMPREHENSIVE METABOLIC PANEL
ALT: 18 U/L (ref 0–35)
AST: 20 U/L (ref 0–37)
Albumin: 5 g/dL (ref 3.5–5.2)
Alkaline Phosphatase: 79 U/L (ref 39–117)
BUN: 11 mg/dL (ref 6–23)
CO2: 33 mEq/L — ABNORMAL HIGH (ref 19–32)
Calcium: 10.5 mg/dL (ref 8.4–10.5)
Chloride: 102 mEq/L (ref 96–112)
Creatinine, Ser: 0.95 mg/dL (ref 0.40–1.20)
GFR: 66.1 mL/min (ref >60.00)
Glucose, Bld: 113 mg/dL — ABNORMAL HIGH (ref 70–99)
Potassium: 4.1 mEq/L (ref 3.5–5.1)
Sodium: 141 mEq/L (ref 135–145)
Total Bilirubin: 0.9 mg/dL (ref 0.2–1.2)
Total Protein: 8.1 g/dL (ref 6.0–8.3)

## 2020-09-03 MED ORDER — LOSARTAN POTASSIUM 50 MG PO TABS: 50.0000 mg | ORAL_TABLET | Freq: Every day | ORAL | 1 refills | Status: DC

## 2020-09-03 NOTE — Assessment & Plan Note (Signed)
Well controlled, no changes to meds. Encouraged heart healthy diet such as the DASH diet and exercise as tolerated.  °

## 2020-09-03 NOTE — Progress Notes (Signed)
Subjective:    Patient ID: Stacy [facility], female    DOB: 1960-11-01, 60 y.o.   MRN: [id number]  Chief Complaint  Patient presents with  . Hypertension  . Hyperlipidemia  . Follow-up    HPI Patient is in today for f/u bp and cholesterol   No other complaints   Past Medical History:  Diagnosis Date  . Anemia   . Fibroid   . Hemorrhoids   . Hypertension     No past surgical history on file.  Family History  Problem Relation Age of Onset  . Diabetes Father   . Hypertension Mother   . Pulmonary embolism Mother   . Pulmonary embolism Brother   . Clotting disorder Daughter     Social History   Socioeconomic History  . Marital status: Married    Spouse name: Not on file  . Number of children: 1  . Years of education: Not on file  . Highest education level: Not on file  Occupational History  . Not on file  Tobacco Use  . Smoking status: Never Smoker  . Smokeless tobacco: Never Used  Vaping Use  . Vaping Use: Never used  Substance and Sexual Activity  . Alcohol use: Yes    Alcohol/week: 2.0 standard drinks    Types: 2 Glasses of wine per week  . Drug use: No  . Sexual activity: Yes    Partners: Male  Other Topics Concern  . Not on file  Social History Narrative  . Not on file   Social Determinants of Health   Financial Resource Strain: Not on file  Food Insecurity: Not on file  Transportation Needs: Not on file  Physical Activity: Not on file  Stress: Not on file  Social Connections: Not on file  Intimate Partner Violence: Not on file    Outpatient Medications Prior to Visit  Medication Sig Dispense Refill  . atorvastatin (LIPITOR) 20 MG tablet Take 1 tablet (20 mg total) by mouth daily. 90 tablet 1  . metoprolol succinate (TOPROL-XL) 25 MG 24 hr tablet TAKE ONE TABLET BY MOUTH ONE TIME DAILY 90 tablet 2  . losartan (COZAAR) 50 MG tablet TAKE ONE TABLET BY MOUTH ONE TIME DAILY 90 tablet 1   No facility-administered medications prior to visit.     No Known Allergies  Review of Systems  Constitutional: Negative for chills, fever and malaise/fatigue.  HENT: Negative for congestion and hearing loss.   Eyes: Negative for discharge.  Respiratory: Negative for cough, sputum production and shortness of breath.   Cardiovascular: Negative for chest pain, palpitations and leg swelling.  Gastrointestinal: Negative for abdominal pain, blood in stool, constipation, diarrhea, heartburn, nausea and vomiting.  Genitourinary: Negative for dysuria, frequency, hematuria and urgency.  Musculoskeletal: Negative for back pain, falls and myalgias.  Skin: Negative for rash.  Neurological: Negative for dizziness, sensory change, loss of consciousness, weakness and headaches.  Endo/Heme/Allergies: Negative for environmental allergies. Does not bruise/bleed easily.  Psychiatric/Behavioral: Negative for depression and suicidal ideas. The patient is not nervous/anxious and does not have insomnia.        Objective:    Physical Exam Vitals and nursing note reviewed.  Constitutional:      Appearance: She is well-developed.  HENT:     Head: Normocephalic and atraumatic.  Eyes:     Conjunctiva/sclera: Conjunctivae normal.  Neck:     Thyroid: No thyromegaly.     Vascular: No carotid bruit or JVD.  Cardiovascular:     Rate and Rhythm:  Normal rate and regular rhythm.     Heart sounds: Normal heart sounds. No murmur heard.   Pulmonary:     Effort: Pulmonary effort is normal. No respiratory distress.     Breath sounds: Normal breath sounds. No wheezing or rales.  Chest:     Chest wall: No tenderness.  Musculoskeletal:     Cervical back: Normal range of motion and neck supple.  Neurological:     Mental Status: She is alert and oriented to person, place, and time.     BP 120/84 (BP Location: Right Arm, Patient Position: Sitting, Cuff Size: Normal)   Pulse 89   Temp 97.8 F (36.6 C) (Oral)   Resp 18   Ht 5\' 5"  (1.651 m)   Wt 186 lb (84.4 kg)    SpO2 99%   BMI 30.95 kg/m  Wt Readings from Last 3 Encounters:  09/03/20 186 lb (84.4 kg)  07/03/19 184 lb 9.6 oz (83.7 kg)  03/07/19 184 lb (83.5 kg)    Diabetic Foot Exam - Simple   No data filed    Lab Results  Component Value Date   WBC 7.5 02/18/2019   HGB 12.7 02/18/2019   HCT 42.1 02/18/2019   PLT 210 02/18/2019   GLUCOSE 92 07/03/2019   CHOL 171 07/03/2019   TRIG 76.0 07/03/2019   HDL 55.60 07/03/2019   LDLDIRECT 192.0 02/22/2017   LDLCALC 100 (H) 07/03/2019   ALT 18 07/03/2019   AST 21 07/03/2019   NA 140 07/03/2019   K 4.0 07/03/2019   CL 103 07/03/2019   CREATININE 0.94 07/03/2019   BUN 14 07/03/2019   CO2 33 (H) 07/03/2019   TSH 1.39 02/22/2017   INR 1.1 (H) 04/23/2018   INR 1.1 04/23/2018    Lab Results  Component Value Date   TSH 1.39 02/22/2017   Lab Results  Component Value Date   WBC 7.5 02/18/2019   HGB 12.7 02/18/2019   HCT 42.1 02/18/2019   MCV 76.0 (L) 02/18/2019   PLT 210 02/18/2019   Lab Results  Component Value Date   NA 140 07/03/2019   K 4.0 07/03/2019   CO2 33 (H) 07/03/2019   GLUCOSE 92 07/03/2019   BUN 14 07/03/2019   CREATININE 0.94 07/03/2019   BILITOT 0.8 07/03/2019   ALKPHOS 85 07/03/2019   AST 21 07/03/2019   ALT 18 07/03/2019   PROT 7.5 07/03/2019   ALBUMIN 4.5 07/03/2019   CALCIUM 9.8 07/03/2019   ANIONGAP 7 02/18/2019   GFR 74.21 07/03/2019   Lab Results  Component Value Date   CHOL 171 07/03/2019   Lab Results  Component Value Date   HDL 55.60 07/03/2019   Lab Results  Component Value Date   LDLCALC 100 (H) 07/03/2019   Lab Results  Component Value Date   TRIG 76.0 07/03/2019   Lab Results  Component Value Date   CHOLHDL 3 07/03/2019   No results found for: HGBA1C     Assessment & Plan:   Problem List Items Addressed This Visit      Unprioritized   Essential hypertension    Well controlled, no changes to meds. Encouraged heart healthy diet such as the DASH diet and exercise as  tolerated.       Relevant Medications   losartan (COZAAR) 50 MG tablet   Hyperlipidemia - Primary    Encouraged heart healthy diet, increase exercise, avoid trans fats, consider a krill oil cap daily      Relevant Medications  losartan (COZAAR) 50 MG tablet   Other Relevant Orders   Lipid panel   Comprehensive metabolic panel      I have changed Mavery Qin's losartan. I am also having her maintain her metoprolol succinate and atorvastatin.  Meds ordered this encounter  Medications  . losartan (COZAAR) 50 MG tablet    Sig: Take 1 tablet (50 mg total) by mouth daily.    Dispense:  90 tablet    Refill:  1     Ann Held, DO

## 2020-09-03 NOTE — Patient Instructions (Signed)

## 2020-09-03 NOTE — Assessment & Plan Note (Signed)
Encouraged heart healthy diet, increase exercise, avoid trans fats, consider a krill oil cap daily 

## 2020-09-08 ENCOUNTER — Other Ambulatory Visit: Payer: Self-pay | Admitting: Family Medicine

## 2020-09-08 DIAGNOSIS — I10 Essential (primary) hypertension: Secondary | ICD-10-CM

## 2020-09-09 ENCOUNTER — Other Ambulatory Visit: Payer: Self-pay | Admitting: Family Medicine

## 2020-09-09 DIAGNOSIS — I10 Essential (primary) hypertension: Secondary | ICD-10-CM

## 2020-09-10 ENCOUNTER — Other Ambulatory Visit: Payer: Self-pay | Admitting: Family Medicine

## 2020-09-10 DIAGNOSIS — I10 Essential (primary) hypertension: Secondary | ICD-10-CM

## 2020-09-11 ENCOUNTER — Encounter (HOSPITAL_BASED_OUTPATIENT_CLINIC_OR_DEPARTMENT_OTHER): Payer: Self-pay

## 2020-09-11 DIAGNOSIS — Z1231 Encounter for screening mammogram for malignant neoplasm of breast: Secondary | ICD-10-CM

## 2020-09-14 ENCOUNTER — Other Ambulatory Visit (HOSPITAL_BASED_OUTPATIENT_CLINIC_OR_DEPARTMENT_OTHER): Payer: Self-pay | Admitting: Family Medicine

## 2020-09-14 DIAGNOSIS — Z1231 Encounter for screening mammogram for malignant neoplasm of breast: Secondary | ICD-10-CM

## 2020-09-18 ENCOUNTER — Ambulatory Visit (HOSPITAL_BASED_OUTPATIENT_CLINIC_OR_DEPARTMENT_OTHER)
Admission: RE | Admit: 2020-09-18 | Discharge: 2020-09-18 | Disposition: A | Discharge status: Home / Self Care | Payer: Self-pay | Source: Ambulatory Visit | Attending: Family Medicine | Admitting: Family Medicine

## 2020-09-18 ENCOUNTER — Other Ambulatory Visit: Payer: Self-pay

## 2020-09-18 DIAGNOSIS — Z1231 Encounter for screening mammogram for malignant neoplasm of breast: Secondary | ICD-10-CM | POA: Insufficient documentation

## 2020-10-26 ENCOUNTER — Encounter (HOSPITAL_BASED_OUTPATIENT_CLINIC_OR_DEPARTMENT_OTHER): Payer: Self-pay | Admitting: Emergency Medicine

## 2020-10-26 ENCOUNTER — Emergency Department (HOSPITAL_BASED_OUTPATIENT_CLINIC_OR_DEPARTMENT_OTHER): Payer: Self-pay

## 2020-10-26 ENCOUNTER — Emergency Department (HOSPITAL_BASED_OUTPATIENT_CLINIC_OR_DEPARTMENT_OTHER)
Admission: EM | Admit: 2020-10-26 | Discharge: 2020-10-26 | Disposition: A | Discharge status: Home / Self Care | Payer: Self-pay | Attending: Emergency Medicine | Admitting: Emergency Medicine

## 2020-10-26 ENCOUNTER — Other Ambulatory Visit: Payer: Self-pay

## 2020-10-26 DIAGNOSIS — Z79899 Other long term (current) drug therapy: Secondary | ICD-10-CM | POA: Insufficient documentation

## 2020-10-26 DIAGNOSIS — R509 Fever, unspecified: Secondary | ICD-10-CM | POA: Insufficient documentation

## 2020-10-26 DIAGNOSIS — I10 Essential (primary) hypertension: Secondary | ICD-10-CM | POA: Insufficient documentation

## 2020-10-26 DIAGNOSIS — M549 Dorsalgia, unspecified: Secondary | ICD-10-CM | POA: Insufficient documentation

## 2020-10-26 DIAGNOSIS — R079 Chest pain, unspecified: Secondary | ICD-10-CM | POA: Insufficient documentation

## 2020-10-26 DIAGNOSIS — M542 Cervicalgia: Secondary | ICD-10-CM | POA: Insufficient documentation

## 2020-10-26 LAB — COMPREHENSIVE METABOLIC PANEL
ALT: 18 U/L (ref 0–44)
AST: 20 U/L (ref 15–41)
Albumin: 4 g/dL (ref 3.5–5.0)
Alkaline Phosphatase: 80 U/L (ref 38–126)
Anion gap: 7 (ref 5–15)
BUN: 14 mg/dL (ref 6–20)
CO2: 28 mmol/L (ref 22–32)
Calcium: 9.3 mg/dL (ref 8.9–10.3)
Chloride: 104 mmol/L (ref 98–111)
Creatinine, Ser: 0.73 mg/dL (ref 0.44–1.00)
GFR, Estimated: >60 mL/min (ref >60)
Glucose, Bld: 98 mg/dL (ref 70–99)
Potassium: 3.5 mmol/L (ref 3.5–5.1)
Sodium: 139 mmol/L (ref 135–145)
Total Bilirubin: 0.8 mg/dL (ref 0.3–1.2)
Total Protein: 7.4 g/dL (ref 6.5–8.1)

## 2020-10-26 LAB — CBC WITH DIFFERENTIAL/PLATELET
Abs Immature Granulocytes: 0.02 10*3/uL (ref 0.00–0.07)
Basophils Absolute: 0.1 10*3/uL (ref 0.0–0.1)
Basophils Relative: 1 %
Eosinophils Absolute: 0.2 10*3/uL (ref 0.0–0.5)
Eosinophils Relative: 3 %
HCT: 37.8 % (ref 36.0–46.0)
Hemoglobin: 11.7 g/dL — ABNORMAL LOW (ref 12.0–15.0)
Immature Granulocytes: 0 %
Lymphocytes Relative: 31 %
Lymphs Abs: 2 10*3/uL (ref 0.7–4.0)
MCH: 23.4 pg — ABNORMAL LOW (ref 26.0–34.0)
MCHC: 31 g/dL (ref 30.0–36.0)
MCV: 75.4 fL — ABNORMAL LOW (ref 80.0–100.0)
Monocytes Absolute: 0.5 10*3/uL (ref 0.1–1.0)
Monocytes Relative: 7 %
Neutro Abs: 3.6 10*3/uL (ref 1.7–7.7)
Neutrophils Relative %: 58 %
Platelets: 177 10*3/uL (ref 150–400)
RBC: 5.01 MIL/uL (ref 3.87–5.11)
RDW: 15.4 % (ref 11.5–15.5)
WBC: 6.4 10*3/uL (ref 4.0–10.5)
nRBC: 0 % (ref 0.0–0.2)

## 2020-10-26 LAB — D-DIMER, QUANTITATIVE: D-Dimer, Quant: <0.27 ug/mL-FEU (ref 0.00–0.50)

## 2020-10-26 LAB — TROPONIN I (HIGH SENSITIVITY): Troponin I (High Sensitivity): 2 ng/L (ref <18)

## 2020-10-26 MED ORDER — KETOROLAC TROMETHAMINE 30 MG/ML IJ SOLN
30.0000 mg | Freq: Once | INTRAMUSCULAR | Status: AC
  Administered 2020-10-26: 30 mg via INTRAVENOUS
  Filled 2020-10-26: qty 1

## 2020-10-26 NOTE — ED Triage Notes (Signed)
Complaining of pain in ribs and radiation to back with inspiration. Unable to take deep breaths. Ibuprofen not helping.

## 2020-10-26 NOTE — ED Provider Notes (Signed)
Stacy [facility] Provider Note   CSN: [id number] Arrival date & time: 10/26/20  0740     History Chief Complaint  Patient presents with  . Chest Pain    Stacy Bond is a 60 y.o. female with a history of hypertension, anemia, presenting to emergency [facility] with chest pain.  The patient reports that she began having onset of chest and back pain approximately 24 hours ago.  She does not recall any inciting events, falls or trauma.  She says she was "feeling a little unwell" earlier in the day, although she cannot describe this further.  She describes the pain that is nondescript, not specifically sharp, this seems to radiate around her chest wall towards her neck and her back.  The pain is worse with repositioning, sitting upright, walking.  It is better with ibuprofen, which she has been taking at home.  It is also better when she is sitting at rest.  It seems to be worse when she takes a deep inspiration.  She reports she has had a prior similar episode in the past, approximately a year ago, which resolved with ibuprofen.  She feels she may have had a fever last night, and reports a temperature of 101F.  She did not take any Tylenol, Motrin, or antipyretics this morning.  She denies any fevers, cough, congestion, history of pneumonia.  She denies any dysuria, hematuria, or history of kidney stone.  She does report a history of uterine fibroids and issues with constipation, but is able to have bowel movements and MiraLAX.  She denies vomiting.  She has had the COVID vaccines.  She does report a family history of PE in her mother.  She herself denies any unilateral calf swelling or tenderness, any hormone use, any recent surgeries or prolonged travel or immobilization.  She denies any personal history of cardiac disease, smoking, MI.   HPI     Past Medical History:  Diagnosis Date  . Anemia   . Fibroid   . Hemorrhoids   . Hypertension     Patient  Active Problem List   Diagnosis Date Noted  . Essential hypertension 08/06/2018  . Tooth abscess 08/06/2018  . Hyperlipidemia 09/01/2017  . Slow transit constipation 08/31/2017  . Fibroid 03/14/2017  . Chest pain 04/15/2013  . Chronic serous otitis media 03/20/2011  . Seasonal allergies 03/16/2011    History reviewed. No pertinent surgical history.   OB History    Gravida  1   Para  1   Term      Preterm      AB      Living        SAB      IAB      Ectopic      Multiple      Live Births  1           Family History  Problem Relation Age of Onset  . Diabetes Father   . Hypertension Mother   . Pulmonary embolism Mother   . Pulmonary embolism Brother   . Clotting disorder Daughter     Social History   Tobacco Use  . Smoking status: Never Smoker  . Smokeless tobacco: Never Used  Vaping Use  . Vaping Use: Never used  Substance Use Topics  . Alcohol use: Yes    Alcohol/week: 2.0 standard drinks    Types: 2 Glasses of wine per week  . Drug use: No    Home Medications Prior to  Admission medications   Medication Sig Start Date End Date Taking? Authorizing Provider  atorvastatin (LIPITOR) 20 MG tablet Take 1 tablet (20 mg total) by mouth daily. 07/26/20   Roma Schanz R, DO  losartan (COZAAR) 50 MG tablet TAKE ONE TABLET BY MOUTH ONE TIME DAILY 09/08/20   Carollee Herter, Kendrick Fries R, DO  metoprolol succinate (TOPROL-XL) 25 MG 24 hr tablet TAKE ONE TABLET BY MOUTH ONE TIME DAILY 12/02/19   Ann Held, DO    Allergies    Patient has no known allergies.  Review of Systems   Review of Systems  Constitutional: Negative for chills and fever.  HENT: Negative for ear pain and sore throat.   Eyes: Negative for pain and visual disturbance.  Respiratory: Positive for shortness of breath. Negative for cough.   Cardiovascular: Positive for chest pain. Negative for palpitations.  Gastrointestinal: Negative for abdominal pain and vomiting.   Genitourinary: Negative for dysuria and hematuria.  Musculoskeletal: Positive for arthralgias and back pain.  Skin: Negative for color change and rash.  Neurological: Negative for syncope and light-headedness.  All other systems reviewed and are negative.   Physical Exam Updated Vital Signs BP (!) 164/96 (BP Location: Right Arm)   Pulse 60   Temp 98.2 F (36.8 C) (Oral)   Resp 18   Ht 5\' 5"  (1.651 m)   Wt 81.6 kg   SpO2 100%   BMI 29.95 kg/m   Physical Exam Constitutional:      General: She is not in acute distress. HENT:     Head: Normocephalic and atraumatic.  Eyes:     Conjunctiva/sclera: Conjunctivae normal.     Pupils: Pupils are equal, round, and reactive to light.  Cardiovascular:     Rate and Rhythm: Normal rate and regular rhythm.  Pulmonary:     Effort: Pulmonary effort is normal. No respiratory distress.  Chest:     Comments: Chest and back pain reproducible with straightening of spine and repositioning, positive spurling maneuver No spinal midline tenderness No chest wall or sternal tenderness Abdominal:     General: There is no distension.     Tenderness: There is no abdominal tenderness. There is no right CVA tenderness, left CVA tenderness, guarding or rebound. Negative signs include Murphy's sign and McBurney's sign.  Musculoskeletal:     Right lower leg: No tenderness. No edema.     Left lower leg: No tenderness. No edema.  Skin:    General: Skin is warm and dry.  Neurological:     General: No focal deficit present.     Mental Status: She is alert. Mental status is at baseline.  Psychiatric:        Mood and Affect: Mood normal.        Behavior: Behavior normal.     ED Results / Procedures / Treatments   Labs (all labs ordered are listed, but only abnormal results are displayed) Labs Reviewed  CBC WITH DIFFERENTIAL/PLATELET - Abnormal; Notable for the following components:      Result Value   Hemoglobin 11.7 (*)    MCV 75.4 (*)    MCH  23.4 (*)    All other components within normal limits  COMPREHENSIVE METABOLIC PANEL  D-DIMER, QUANTITATIVE  TROPONIN I (HIGH SENSITIVITY)    EKG EKG Interpretation  Date/Time:  Tuesday Oct 26 2020 08:21:02 EDT Ventricular Rate:  63 PR Interval:  212 QRS Duration: 89 QT Interval:  429 QTC Calculation: 440 R Axis:   27 Text  Interpretation: Sinus rhythm Prolonged PR interval Borderline T wave abnormalities, seen on prior tracing Sept 2020, no significant changes in ECG, no STEMI Confirmed by Octaviano Glow [phone]) on 10/26/2020 9:16:48 AM   Radiology DG Chest Portable 1 View  Result Date: 10/26/2020 CLINICAL DATA:  Chest pain EXAM: PORTABLE CHEST 1 VIEW COMPARISON:  02/18/2019 FINDINGS: Heart and mediastinal contours are within normal limits. No focal opacities or effusions. No acute bony abnormality. IMPRESSION: No active disease. Electronically Signed   By: Rolm Baptise M.D.   On: 10/26/2020 08:44    Procedures Procedures   Medications Ordered in ED Medications  ketorolac (TORADOL) 30 MG/ML injection 30 mg (30 mg Intravenous Given 10/26/20 8119)    ED Course  I have reviewed the triage vital signs and the nursing notes.  Pertinent labs & imaging results that were available during my care of the patient were reviewed by me and considered in my medical decision making (see chart for details).  This patient presents to the Emergency [facility] with complaint of chest pain. This involves an extensive number of treatment options, and is a complaint that carries with it a high risk of complications and morbidity.  The differential diagnosis includes cervical angina vs ACS vs Pneumothorax vs Reflux/Gastritis vs MSK pain vs Pneumonia vs other.  I have a high suspicion for cervical angina.  It appears that her chest pain is reproducible with straightening of the spine and axial loading on the spine.  We will give a dose of Toradol, as her pain also appears improved with  anti-inflammatories.  She has a family history of PE, and is describing pleuritic pain and shortness of breath.  We discussed a D-dimer and further work-up for this if this is positive.  She is in agreement at this time.  I ordered, reviewed, and interpreted labs, including BMP and CBC.  There were no immediate, life-threatening emergencies found in this labwork.  The patient's troponin level was 2.  Ddimer negative a 0.27. I ordered medication toradol for chest pain I ordered imaging studies which included dg chest I independently visualized and interpreted imaging which showed no focal disease, no PNA or PTX, and the monitor tracing which showed regular rhythm I personally reviewed the patients ECG which showed sinus rhythm with no acute ischemic findings  After the interventions stated above, I reevaluated the patient and found that they remained clinically stable.  The toradol improved her pain significantly.  Based on the patient's clinical exam, vital signs, risk factors, and ED testing, I felt that the patient's overall risk of life-threatening emergency such as ACS, PE, sepsis, or infection was low.  At this time, I felt the patient's presentation was most clinically consistent with cervical angina, but explained to the patient that this evaluation was not a definitive diagnostic workup.  I discussed outpatient follow up with primary care provider, and provided specialist office number on the patient's discharge paper if a referral was deemed necessary.  Return precautions were discussed with the patient.  I felt the patient was clinically stable for discharge.      Final Clinical Impression(s) / ED Diagnoses Final diagnoses:  Cervical pain  Chest pain, unspecified type    Rx / DC Orders ED Discharge Orders    None       Wyvonnia Dusky, MD 10/26/20 1751

## 2020-10-26 NOTE — Discharge Instructions (Addendum)
You were seen in the ER for an episode of chest pain and back pain.  I suspect this may be related to cervical angina, or nerve pain in your neck which causes pressure sensation in your chest.  You can continue taking ibuprofen (600 mg every 6 hours) and Tylenol (650 mg every 6 hours) at home for the next 7 to 10 days, as needed for pain.  We talked about how to change your posture to try to keep your back straighter in general.  Please follow-up with your primary care doctor for this issue.  If your pain becomes significantly worse, or you are having difficulty breathing, begin to feel lightheaded, or begin having other concerning new symptoms, please call 911 and return back to the ER.  We also talked about her uterine fibroids.  I would advise that you see an OB/GYN doctor about this issue, particularly if you are having problems with your bowel movements.  You have large fibroids, which may be pushing on your bladder, bowels or other structures.  You should discuss how to manage this with a specialist, who may or may not recommend surgery.

## 2020-10-28 ENCOUNTER — Other Ambulatory Visit: Payer: Self-pay | Admitting: Family Medicine

## 2020-10-28 DIAGNOSIS — I10 Essential (primary) hypertension: Secondary | ICD-10-CM

## 2020-11-20 ENCOUNTER — Other Ambulatory Visit: Payer: Self-pay | Admitting: Family Medicine

## 2020-11-20 DIAGNOSIS — E785 Hyperlipidemia, unspecified: Secondary | ICD-10-CM

## 2021-03-07 ENCOUNTER — Encounter: Payer: Self-pay | Admitting: Family Medicine

## 2021-03-10 ENCOUNTER — Encounter: Payer: Self-pay | Admitting: Family Medicine

## 2021-03-14 ENCOUNTER — Other Ambulatory Visit: Payer: Self-pay | Admitting: Family Medicine

## 2021-03-14 DIAGNOSIS — I10 Essential (primary) hypertension: Secondary | ICD-10-CM

## 2021-03-31 ENCOUNTER — Ambulatory Visit (INDEPENDENT_AMBULATORY_CARE_PROVIDER_SITE_OTHER): Payer: Self-pay | Admitting: Medical

## 2021-03-31 ENCOUNTER — Encounter: Payer: Self-pay | Admitting: Medical

## 2021-03-31 ENCOUNTER — Other Ambulatory Visit: Payer: Self-pay

## 2021-03-31 VITALS — BP 140/80 | HR 70 | Temp 98.5°F | Resp 18 | Ht 65.0 in | Wt 182.6 lb

## 2021-03-31 DIAGNOSIS — J3489 Other specified disorders of nose and nasal sinuses: Secondary | ICD-10-CM

## 2021-03-31 DIAGNOSIS — R6884 Jaw pain: Secondary | ICD-10-CM

## 2021-03-31 DIAGNOSIS — I10 Essential (primary) hypertension: Secondary | ICD-10-CM

## 2021-03-31 DIAGNOSIS — M792 Neuralgia and neuritis, unspecified: Secondary | ICD-10-CM

## 2021-03-31 DIAGNOSIS — M272 Inflammatory conditions of jaws: Secondary | ICD-10-CM

## 2021-03-31 MED ORDER — FAMCICLOVIR 500 MG PO TABS: 500.0000 mg | ORAL_TABLET | Freq: Three times a day (TID) | ORAL | 0 refills | Status: DC

## 2021-03-31 MED ORDER — AMOXICILLIN-POT CLAVULANATE 875-125 MG PO TABS: 1.0000 | ORAL_TABLET | Freq: Two times a day (BID) | ORAL | 0 refills | Status: DC

## 2021-03-31 MED ORDER — TRAMADOL HCL 50 MG PO TABS: 50.0000 mg | ORAL_TABLET | Freq: Four times a day (QID) | ORAL | 0 refills | Status: AC | PRN

## 2021-03-31 NOTE — Progress Notes (Signed)
Subjective:    Patient ID: Stacy [facility], female    DOB: September 27, 1960, 60 y.o.   MRN: [id number]  HPI  Since Monday has had left lower jaw stabbing sharp pain that feels like ice pick. No ear pain. No teeth pain. Mild nasal congestion 2 weeks ago. No skin rash. Feels tired but no fever, no chills or sweats.   Covid booster one month ago.     Review of Systems  Constitutional:  Negative for chills, fatigue and fever.  HENT:         Jaw pain left side. See hpi.  Respiratory:  Negative for cough, chest tightness and wheezing.   Cardiovascular:  Negative for chest pain and palpitations.  Gastrointestinal:  Negative for abdominal pain.  Genitourinary:  Negative for dyspareunia, dysuria, enuresis, flank pain, frequency and hematuria.  Neurological:  Negative for dizziness, syncope, weakness and headaches.  Hematological:  Negative for adenopathy.  Psychiatric/Behavioral:  Negative for behavioral problems.     Past Medical History:  Diagnosis Date   Anemia    Fibroid    Hemorrhoids    Hypertension      Social History   Socioeconomic History   Marital status: Married    Spouse name: Not on file   Number of children: 1   Years of education: Not on file   Highest education level: Not on file  Occupational History   Not on file  Tobacco Use   Smoking status: Never   Smokeless tobacco: Never  Vaping Use   Vaping Use: Never used  Substance and Sexual Activity   Alcohol use: Yes    Alcohol/week: 2.0 standard drinks    Types: 2 Glasses of wine per week   Drug use: No   Sexual activity: Yes    Partners: Male  Other Topics Concern   Not on file  Social History Narrative   Not on file   Social Determinants of Health   Financial Resource Strain: Not on file  Food Insecurity: Not on file  Transportation Needs: Not on file  Physical Activity: Not on file  Stress: Not on file  Social Connections: Not on file  Intimate Partner Violence: Not on file    No past surgical  history on file.  Family History  Problem Relation Age of Onset   Diabetes Father    Hypertension Mother    Pulmonary embolism Mother    Pulmonary embolism Brother    Clotting disorder Daughter     No Known Allergies  Current Outpatient Medications on File Prior to Visit  Medication Sig Dispense Refill   atorvastatin (LIPITOR) 20 MG tablet TAKE ONE TABLET BY MOUTH ONE TIME DAILY 90 tablet 1   losartan (COZAAR) 50 MG tablet TAKE ONE TABLET BY MOUTH ONE TIME DAILY 30 tablet 0   metoprolol succinate (TOPROL-XL) 25 MG 24 hr tablet Take 1 tablet (25 mg total) by mouth daily. Take with or immediately following a meal 90 tablet 1   No current facility-administered medications on file prior to visit.    BP 140/80   Pulse 70   Temp 98.5 F (36.9 C)   Resp 18   Ht 5\' 5"  (1.651 m)   Wt 182 lb 9.6 oz (82.8 kg)   SpO2 100%   BMI 30.39 kg/m       Objective:   Physical Exam  General- No acute distress. Pleasant patient. Neck- Full range of motion, no jvd Lungs- Clear, even and unlabored. Heart- regular rate and rhythm. Neurologic-  CNII- XII grossly intact.  Heent-Maxillary sinus pressure. Mild left mandible tenderness to palpation. Normal left ear canal and nml tm. No tragal tenderness.no posterior auricle pain. Mouth exam- lower teeth various teeth absent. Teeth present not tender to palpation      Assessment & Plan:   Patient Instructions  Left jaw pain intermittent and sharp pain possible from tooth infection. Rx augmentin antibiotic.  Some sinus pressure as well 2 weeks post nasal congestion. Augmentin has coverage for sinus infection.  Approaching weekend if you start to get severe constant sharp pain then start tramadol. With pain describe considering rare diagnosis trigeminal neuralgia or even early shingles.  If over long weekend any smal blister eruption/shingles like eruption then start famvir.  Follow up in 5 days or sooner if needed.    Mackie Pai, PA-C    Time spent with patient today was  30 minutes which consisted of chart review, discussing diagnosis, work up treatment and documentation.

## 2021-03-31 NOTE — Patient Instructions (Addendum)
Left jaw pain intermittent and sharp pain possible from tooth infection. Rx augmentin antibiotic.  Some sinus pressure as well 2 weeks post nasal congestion. Augmentin has coverage for sinus infection.  Approaching weekend if you start to get severe constant sharp pain then start tramadol. With pain describe considering rare diagnosis trigeminal neuralgia or even early shingles.  If over long weekend any small blister eruption/shingles like eruption then start famvir.  Your blood pressure is borderline controlled. Recommend for can supplament low dose tylenol and ibuprofen for pain if needed. But not to use ibuprofen dose over 400 mg.  Follow up in 5 days or sooner if needed.

## 2021-04-25 ENCOUNTER — Other Ambulatory Visit: Payer: Self-pay | Admitting: Family Medicine

## 2021-04-25 DIAGNOSIS — I10 Essential (primary) hypertension: Secondary | ICD-10-CM

## 2021-05-30 ENCOUNTER — Other Ambulatory Visit: Payer: Self-pay | Admitting: Family Medicine

## 2021-05-30 DIAGNOSIS — E785 Hyperlipidemia, unspecified: Secondary | ICD-10-CM

## 2021-07-19 ENCOUNTER — Other Ambulatory Visit: Payer: Self-pay | Admitting: Family Medicine

## 2021-07-19 DIAGNOSIS — I10 Essential (primary) hypertension: Secondary | ICD-10-CM

## 2021-10-17 ENCOUNTER — Other Ambulatory Visit: Payer: Self-pay | Admitting: Family Medicine

## 2021-10-17 DIAGNOSIS — I10 Essential (primary) hypertension: Secondary | ICD-10-CM

## 2021-10-20 ENCOUNTER — Other Ambulatory Visit: Payer: Self-pay | Admitting: Family Medicine

## 2021-10-20 DIAGNOSIS — E785 Hyperlipidemia, unspecified: Secondary | ICD-10-CM

## 2021-10-20 DIAGNOSIS — I10 Essential (primary) hypertension: Secondary | ICD-10-CM

## 2021-10-30 ENCOUNTER — Other Ambulatory Visit: Payer: Self-pay | Admitting: Family Medicine

## 2021-10-30 DIAGNOSIS — E785 Hyperlipidemia, unspecified: Secondary | ICD-10-CM

## 2021-11-28 ENCOUNTER — Emergency Department (HOSPITAL_BASED_OUTPATIENT_CLINIC_OR_DEPARTMENT_OTHER): Payer: No Typology Code available for payment source

## 2021-11-28 ENCOUNTER — Emergency Department (HOSPITAL_BASED_OUTPATIENT_CLINIC_OR_DEPARTMENT_OTHER)
Admission: EM | Admit: 2021-11-28 | Discharge: 2021-11-28 | Disposition: A | Discharge status: Home / Self Care | Payer: No Typology Code available for payment source | Attending: Emergency Medicine | Admitting: Emergency Medicine

## 2021-11-28 ENCOUNTER — Encounter (HOSPITAL_BASED_OUTPATIENT_CLINIC_OR_DEPARTMENT_OTHER): Payer: Self-pay | Admitting: Emergency Medicine

## 2021-11-28 ENCOUNTER — Other Ambulatory Visit: Payer: Self-pay

## 2021-11-28 DIAGNOSIS — Y92410 Unspecified street and highway as the place of occurrence of the external cause: Secondary | ICD-10-CM | POA: Insufficient documentation

## 2021-11-28 DIAGNOSIS — S161XXA Strain of muscle, fascia and tendon at neck level, initial encounter: Secondary | ICD-10-CM | POA: Diagnosis not present

## 2021-11-28 DIAGNOSIS — R911 Solitary pulmonary nodule: Secondary | ICD-10-CM | POA: Insufficient documentation

## 2021-11-28 DIAGNOSIS — I10 Essential (primary) hypertension: Secondary | ICD-10-CM | POA: Diagnosis not present

## 2021-11-28 DIAGNOSIS — Z79899 Other long term (current) drug therapy: Secondary | ICD-10-CM | POA: Insufficient documentation

## 2021-11-28 DIAGNOSIS — R0789 Other chest pain: Secondary | ICD-10-CM | POA: Insufficient documentation

## 2021-11-28 DIAGNOSIS — S199XXA Unspecified injury of neck, initial encounter: Secondary | ICD-10-CM | POA: Diagnosis present

## 2021-11-28 MED ORDER — LOSARTAN POTASSIUM 25 MG PO TABS
50.0000 mg | ORAL_TABLET | Freq: Every day | ORAL | Status: DC
  Administered 2021-11-28: 50 mg via ORAL
  Filled 2021-11-28: qty 2

## 2021-11-28 MED ORDER — HYDRALAZINE HCL 25 MG PO TABS
25.0000 mg | ORAL_TABLET | Freq: Once | ORAL | Status: AC
  Administered 2021-11-28: 25 mg via ORAL
  Filled 2021-11-28: qty 1

## 2021-11-28 MED ORDER — IBUPROFEN 800 MG PO TABS
800.0000 mg | ORAL_TABLET | Freq: Once | ORAL | Status: AC
  Administered 2021-11-28: 800 mg via ORAL
  Filled 2021-11-28: qty 1

## 2021-11-28 MED ORDER — CYCLOBENZAPRINE HCL 10 MG PO TABS: 10.0000 mg | ORAL_TABLET | Freq: Every day | ORAL | 0 refills | Status: AC

## 2021-11-28 MED ORDER — OXYCODONE-ACETAMINOPHEN 5-325 MG PO TABS
1.0000 | ORAL_TABLET | Freq: Once | ORAL | Status: AC
  Administered 2021-11-28: 1 via ORAL
  Filled 2021-11-28: qty 1
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
## 2021-11-28 NOTE — ED Notes (Signed)
D/c paperwork reviewed with pt and family at bedside.  All questions addressed prior to d/c. Pt ambulatory to ED exit without assistance, NAD noted.
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
## 2021-11-30 ENCOUNTER — Encounter: Payer: Self-pay | Admitting: Family Medicine

## 2021-11-30 ENCOUNTER — Ambulatory Visit (INDEPENDENT_AMBULATORY_CARE_PROVIDER_SITE_OTHER): Payer: Self-pay | Admitting: Family Medicine

## 2021-11-30 VITALS — BP 145/84 | HR 67 | Temp 98.0°F | Resp 16 | Ht 65.0 in | Wt 182.8 lb

## 2021-11-30 DIAGNOSIS — I10 Essential (primary) hypertension: Secondary | ICD-10-CM

## 2021-11-30 DIAGNOSIS — R911 Solitary pulmonary nodule: Secondary | ICD-10-CM

## 2021-11-30 MED ORDER — OLMESARTAN MEDOXOMIL 20 MG PO TABS: 20.0000 mg | ORAL_TABLET | Freq: Every day | ORAL | 2 refills | Status: DC

## 2021-11-30 NOTE — Patient Instructions (Signed)
Check your blood pressures 2-3 times per week, alternating the time of day you check it. If it is high, considering waiting 1-2 minutes and rechecking. If it gets higher, your anxiety is likely creeping up and we should avoid rechecking.   Keep the diet clean and stay active.  Ice/cold pack over area for 10-15 min twice daily.  Heat (pad or rice pillow in microwave) over affected area, 10-15 minutes twice daily.   OK to take Tylenol 1000 mg (2 extra strength tabs) or 975 mg (3 regular strength tabs) every 6 hours as needed.  Let us know if you need anything.  EXERCISES RANGE OF MOTION (ROM) AND STRETCHING EXERCISES  These exercises may help you when beginning to rehabilitate your issue. In order to successfully resolve your symptoms, you must improve your posture. These exercises are designed to help reduce the forward-head and rounded-shoulder posture which contributes to this condition. Your symptoms may resolve with or without further involvement from your physician, physical therapist or athletic trainer. While completing these exercises, remember:  Restoring tissue flexibility helps normal motion to return to the joints. This allows healthier, less painful movement and activity. An effective stretch should be held for at least 20 seconds, although you may need to begin with shorter hold times for comfort. A stretch should never be painful. You should only feel a gentle lengthening or release in the stretched tissue. Do not do any stretch or exercise that you cannot tolerate.  STRETCH- Axial Extensors Lie on your back on the floor. You may bend your knees for comfort. Place a rolled-up hand towel or dish towel, about 2 inches in diameter, under the part of your head that makes contact with the floor. Gently tuck your chin, as if trying to make a "double chin," until you feel a gentle stretch at the base of your head. Hold 15-20 seconds. Repeat 2-3 times. Complete this exercise 1 time per  day.   STRETCH - Axial Extension  Stand or sit on a firm surface. Assume a good posture: chest up, shoulders drawn back, abdominal muscles slightly tense, knees unlocked (if standing) and feet hip width apart. Slowly retract your chin so your head slides back and your chin slightly lowers. Continue to look straight ahead. You should feel a gentle stretch in the back of your head. Be certain not to feel an aggressive stretch since this can cause headaches later. Hold for 15-20 seconds. Repeat 2-3 times. Complete this exercise 1 time per day.  STRETCH - Cervical Side Bend  Stand or sit on a firm surface. Assume a good posture: chest up, shoulders drawn back, abdominal muscles slightly tense, knees unlocked (if standing) and feet hip width apart. Without letting your nose or shoulders move, slowly tip your right / left ear to your shoulder until your feel a gentle stretch in the muscles on the opposite side of your neck. Hold 15-20 seconds. Repeat 2-3 times. Complete this exercise 1-2 times per day.  STRETCH - Cervical Rotators  Stand or sit on a firm surface. Assume a good posture: chest up, shoulders drawn back, abdominal muscles slightly tense, knees unlocked (if standing) and feet hip width apart. Keeping your eyes level with the ground, slowly turn your head until you feel a gentle stretch along the back and opposite side of your neck. Hold 15-20 seconds. Repeat 2-3 times. Complete this exercise 1-2 times per day.  RANGE OF MOTION - Neck Circles  Stand or sit on a firm surface. Assume  a good posture: chest up, shoulders drawn back, abdominal muscles slightly tense, knees unlocked (if standing) and feet hip width apart. Gently roll your head down and around from the back of one shoulder to the back of the other. The motion should never be forced or painful. Repeat the motion 10-20 times, or until you feel the neck muscles relax and loosen. Repeat 2-3 times. Complete the exercise 1-2 times  per day. STRENGTHENING EXERCISES - Cervical Strain and Sprain These exercises may help you when beginning to rehabilitate your injury. They may resolve your symptoms with or without further involvement from your physician, physical therapist, or athletic trainer. While completing these exercises, remember:  Muscles can gain both the endurance and the strength needed for everyday activities through controlled exercises. Complete these exercises as instructed by your physician, physical therapist, or athletic trainer. Progress the resistance and repetitions only as guided. You may experience muscle soreness or fatigue, but the pain or discomfort you are trying to eliminate should never worsen during these exercises. If this pain does worsen, stop and make certain you are following the directions exactly. If the pain is still present after adjustments, discontinue the exercise until you can discuss the trouble with your clinician.  STRENGTH - Cervical Flexors, Isometric Face a wall, standing about 6 inches away. Place a small pillow, a ball about 6-8 inches in diameter, or a folded towel between your forehead and the wall. Slightly tuck your chin and gently push your forehead into the soft object. Push only with mild to moderate intensity, building up tension gradually. Keep your jaw and forehead relaxed. Hold 10 to 20 seconds. Keep your breathing relaxed. Release the tension slowly. Relax your neck muscles completely before you start the next repetition. Repeat 2-3 times. Complete this exercise 1 time per day.  STRENGTH- Cervical Lateral Flexors, Isometric  Stand about 6 inches away from a wall. Place a small pillow, a ball about 6-8 inches in diameter, or a folded towel between the side of your head and the wall. Slightly tuck your chin and gently tilt your head into the soft object. Push only with mild to moderate intensity, building up tension gradually. Keep your jaw and forehead relaxed. Hold 10  to 20 seconds. Keep your breathing relaxed. Release the tension slowly. Relax your neck muscles completely before you start the next repetition. Repeat 2-3 times. Complete this exercise 1 time per day.  STRENGTH - Cervical Extensors, Isometric  Stand about 6 inches away from a wall. Place a small pillow, a ball about 6-8 inches in diameter, or a folded towel between the back of your head and the wall. Slightly tuck your chin and gently tilt your head back into the soft object. Push only with mild to moderate intensity, building up tension gradually. Keep your jaw and forehead relaxed. Hold 10 to 20 seconds. Keep your breathing relaxed. Release the tension slowly. Relax your neck muscles completely before you start the next repetition. Repeat 2-3 times. Complete this exercise 1 time per day.  POSTURE AND BODY MECHANICS CONSIDERATIONS Keeping correct posture when sitting, standing or completing your activities will reduce the stress put on different body tissues, allowing injured tissues a chance to heal and limiting painful experiences. The following are general guidelines for improved posture. Your physician or physical therapist will provide you with any instructions specific to your needs. While reading these guidelines, remember: The exercises prescribed by your provider will help you have the flexibility and strength to maintain correct postures.  The correct posture provides the optimal environment for your joints to work. All of your joints have less wear and tear when properly supported by a spine with good posture. This means you will experience a healthier, less painful body. Correct posture must be practiced with all of your activities, especially prolonged sitting and standing. Correct posture is as important when doing repetitive low-stress activities (typing) as it is when doing a single heavy-load activity (lifting).  PROLONGED STANDING WHILE SLIGHTLY LEANING FORWARD When completing a  task that requires you to lean forward while standing in one place for a long time, place either foot up on a stationary 2- to 4-inch high object to help maintain the best posture. When both feet are on the ground, the low back tends to lose its slight inward curve. If this curve flattens (or becomes too large), then the back and your other joints will experience too much stress, fatigue more quickly, and can cause pain.   RESTING POSITIONS Consider which positions are most painful for you when choosing a resting position. If you have pain with flexion-based activities (sitting, bending, stooping, squatting), choose a position that allows you to rest in a less flexed posture. You would want to avoid curling into a fetal position on your side. If your pain worsens with extension-based activities (prolonged standing, working overhead), avoid resting in an extended position such as sleeping on your stomach. Most people will find more comfort when they rest with their spine in a more neutral position, neither too rounded nor too arched. Lying on a non-sagging bed on your side with a pillow between your knees, or on your back with a pillow under your knees will often provide some relief. Keep in mind, being in any one position for a prolonged period of time, no matter how correct your posture, can still lead to stiffness.  WALKING Walk with an upright posture. Your ears, shoulders, and hips should all line up. OFFICE WORK When working at a desk, create an environment that supports good, upright posture. Without extra support, muscles fatigue and lead to excessive strain on joints and other tissues.  CHAIR: A chair should be able to slide under your desk when your back makes contact with the back of the chair. This allows you to work closely. The chair's height should allow your eyes to be level with the upper part of your monitor and your hands to be slightly lower than your elbows. Body position: Your feet  should make contact with the floor. If this is not possible, use a foot rest. Keep your ears over your shoulders. This will reduce stress on your neck and low back.  Trapezius stretches/exercises Do exercises exactly as told by your health care provider and adjust them as directed. It is normal to feel mild stretching, pulling, tightness, or discomfort as you do these exercises, but you should stop right away if you feel sudden pain or your pain gets worse.   Stretching and range of motion exercises These exercises warm up your muscles and joints and improve the movement and flexibility of your shoulder. These exercises can also help to relieve pain, numbness, and tingling. If you are unable to do any of the following for any reason, do not further attempt to do it.   Exercise A: Flexion, standing     Stand and hold a broomstick, a cane, or a similar object. Place your hands a little more than shoulder-width apart on the object. Your left / right  hand should be palm-up, and your other hand should be palm-down. Push the stick to raise your left / right arm out to your side and then over your head. Use your other hand to help move the stick. Stop when you feel a stretch in your shoulder, or when you reach the angle that is recommended by your health care provider. Avoid shrugging your shoulder while you raise your arm. Keep your shoulder blade tucked down toward your spine. Hold for 30 seconds. Slowly return to the starting position. Repeat 2 times. Complete this exercise 3 times per week.  Exercise B: Abduction, supine     Lie on your back and hold a broomstick, a cane, or a similar object. Place your hands a little more than shoulder-width apart on the object. Your left / right hand should be palm-up, and your other hand should be palm-down. Push the stick to raise your left / right arm out to your side and then over your head. Use your other hand to help move the stick. Stop when you feel a  stretch in your shoulder, or when you reach the angle that is recommended by your health care provider. Avoid shrugging your shoulder while you raise your arm. Keep your shoulder blade tucked down toward your spine. Hold for 30 seconds. Slowly return to the starting position. Repeat 2 times. Complete this exercise 3 times per week.  Exercise C: Flexion, active-assisted     Lie on your back. You may bend your knees for comfort. Hold a broomstick, a cane, or a similar object. Place your hands about shoulder-width apart on the object. Your palms should face toward your feet. Raise the stick and move your arms over your head and behind your head, toward the floor. Use your healthy arm to help your left / right arm move farther. Stop when you feel a gentle stretch in your shoulder, or when you reach the angle where your health care provider tells you to stop. Hold for 30 seconds. Slowly return to the starting position. Repeat 2 times. Complete this exercise 3 times per week.  Exercise D: External rotation and abduction     Stand in a door frame with one of your feet slightly in front of the other. This is called a staggered stance. Choose one of the following positions as told by your health care provider: Place your hands and forearms on the door frame above your head. Place your hands and forearms on the door frame at the height of your head. Place your hands on the door frame at the height of your elbows. Slowly move your weight onto your front foot until you feel a stretch across your chest and in the front of your shoulders. Keep your head and chest upright and keep your abdominal muscles tight. Hold for 30 seconds. To release the stretch, shift your weight to your back foot. Repeat 2 times. Complete this stretch 3 times per week.  Strengthening exercises These exercises build strength and endurance in your shoulder. Endurance is the ability to use your muscles for a long time, even  after your muscles get tired. Exercise E: Scapular depression and adduction  Sit on a stable chair. Support your arms in front of you with pillows, armrests, or a tabletop. Keep your elbows in line with the sides of your body. Gently move your shoulder blades down toward your middle back. Relax the muscles on the tops of your shoulders and in the back of your neck. Hold  for 3 seconds. Slowly release the tension and relax your muscles completely before doing this exercise again. Repeat for a total of 10 repetitions. After you have practiced this exercise, try doing the exercise without the arm support. Then, try the exercise while standing instead of sitting. Repeat 2 times. Complete this exercise 3 times per week.  Exercise F: Shoulder abduction, isometric     Stand or sit about 4-6 inches (10-15 cm) from a wall with your left / right side facing the wall. Bend your left / right elbow and gently press your elbow against the wall. Increase the pressure slowly until you are pressing as hard as you can without shrugging your shoulder. Hold for 3 seconds. Slowly release the tension and relax your muscles completely. Repeat for a total of 10 repetitions. Repeat 2 times. Complete this exercise 3 times per week.  Exercise G: Shoulder flexion, isometric     Stand or sit about 4-6 inches (10-15 cm) away from a wall with your left / right side facing the wall. Keep your left / right elbow straight and gently press the top of your fist against the wall. Increase the pressure slowly until you are pressing as hard as you can without shrugging your shoulder. Hold for 10-15 seconds. Slowly release the tension and relax your muscles completely. Repeat for a total of 10 repetitions. Repeat 2 times. Complete this exercise 3 times per week.  Exercise H: Internal rotation     Sit in a stable chair without armrests, or stand. Secure an exercise band at your left / right side, at elbow height. Place a soft  object, such as a folded towel or a small pillow, under your left / right upper arm so your elbow is a few inches (about 8 cm) away from your side. Hold the end of the exercise band so the band stretches. Keeping your elbow pressed against the soft object under your arm, move your forearm across your body toward your abdomen. Keep your body steady so the movement is only coming from your shoulder. Hold for 3 seconds. Slowly return to the starting position. Repeat for a total of 10 repetitions. Repeat 2 times. Complete this exercise 3 times per week.  Exercise I: External rotation     Sit in a stable chair without armrests, or stand. Secure an exercise band at your left / right side, at elbow height. Place a soft object, such as a folded towel or a small pillow, under your left / right upper arm so your elbow is a few inches (about 8 cm) away from your side. Hold the end of the exercise band so the band stretches. Keeping your elbow pressed against the soft object under your arm, move your forearm out, away from your abdomen. Keep your body steady so the movement is only coming from your shoulder. Hold for 3 seconds. Slowly return to the starting position. Repeat for a total of 10 repetitions. Repeat 2 times. Complete this exercise 3 times per week. Exercise J: Shoulder extension  Sit in a stable chair without armrests, or stand. Secure an exercise band to a stable object in front of you so the band is at shoulder height. Hold one end of the exercise band in each hand. Your palms should face each other. Straighten your elbows and lift your hands up to shoulder height. Step back, away from the secured end of the exercise band, until the band stretches. Squeeze your shoulder blades together and pull your hands  down to the sides of your thighs. Stop when your hands are straight down by your sides. Do not let your hands go behind your body. Hold for 3 seconds. Slowly return to the starting  position. Repeat for a total of 10 repetitions. Repeat 2 times. Complete this exercise 3 times per week.  Exercise K: Shoulder extension, prone     Lie on your abdomen on a firm surface so your left / right arm hangs over the edge. Hold a 5 lb weight in your hand so your palm faces in toward your body. Your arm should be straight. Squeeze your shoulder blade down toward the middle of your back. Slowly raise your arm behind you, up to the height of the surface that you are lying on. Keep your arm straight. Hold for 3 seconds. Slowly return to the starting position and relax your muscles. Repeat for a total of 10 repetitions. Repeat 2 times. Complete this exercise 3 times per week.   Exercise L: Horizontal abduction, prone  Lie on your abdomen on a firm surface so your left / right arm hangs over the edge. Hold a 5 lb weight in your hand so your palm faces toward your feet. Your arm should be straight. Squeeze your shoulder blade down toward the middle of your back. Bend your elbow so your hand moves up, until your elbow is bent to an "L" shape (90 degrees). With your elbow bent, slowly move your forearm forward and up. Raise your hand up to the height of the surface that you are lying on. Your upper arm should not move, and your elbow should stay bent. At the top of the movement, your palm should face the floor. Hold for 3 seconds. Slowly return to the starting position and relax your muscles. Repeat for a total of 10 repetitions. Repeat 2 times. Complete this exercise 3 times per week.  Exercise M: Horizontal abduction, standing  Sit on a stable chair, or stand. Secure an exercise band to a stable object in front of you so the band is at shoulder height. Hold one end of the exercise band in each hand. Straighten your elbows and lift your hands straight in front of you, up to shoulder height. Your palms should face down, toward the floor. Step back, away from the secured end of the  exercise band, until the band stretches. Move your arms out to your sides, and keep your arms straight. Hold for 3 seconds. Slowly return to the starting position. Repeat for a total of 10 repetitions. Repeat 2 times. Complete this exercise 3 times per week.  Exercise N: Scapular retraction and elevation  Sit on a stable chair, or stand. Secure an exercise band to a stable object in front of you so the band is at shoulder height. Hold one end of the exercise band in each hand. Your palms should face each other. Sit in a stable chair without armrests, or stand. Step back, away from the secured end of the exercise band, until the band stretches. Squeeze your shoulder blades together and lift your hands over your head. Keep your elbows straight. Hold for 3 seconds. Slowly return to the starting position. Repeat for a total of 10 repetitions. Repeat 2 times. Complete this exercise 3 times per week.  This information is not intended to replace advice given to you by your health care provider. Make sure you discuss any questions you have with your health care provider. Document Released: 05/29/2005 Document Revised: 02/03/2016 Document  Reviewed: 04/15/2015 Elsevier Interactive Patient Education  2017 [employer].
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
## 2021-12-06 ENCOUNTER — Encounter: Payer: Self-pay | Admitting: Family Medicine

## 2021-12-06 ENCOUNTER — Ambulatory Visit (INDEPENDENT_AMBULATORY_CARE_PROVIDER_SITE_OTHER): Payer: Self-pay | Admitting: Family Medicine

## 2021-12-06 VITALS — BP 138/100 | Ht 65.0 in | Wt 182.0 lb

## 2021-12-06 DIAGNOSIS — M7918 Myalgia, other site: Secondary | ICD-10-CM

## 2021-12-06 DIAGNOSIS — S161XXA Strain of muscle, fascia and tendon at neck level, initial encounter: Secondary | ICD-10-CM | POA: Insufficient documentation
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
## 2021-12-07 ENCOUNTER — Ambulatory Visit: Payer: Self-pay | Admitting: Family

## 2021-12-07 ENCOUNTER — Other Ambulatory Visit (INDEPENDENT_AMBULATORY_CARE_PROVIDER_SITE_OTHER): Payer: Self-pay

## 2021-12-07 DIAGNOSIS — I10 Essential (primary) hypertension: Secondary | ICD-10-CM

## 2021-12-07 LAB — BASIC METABOLIC PANEL
BUN: 12 mg/dL (ref 6–23)
CO2: 32 mEq/L (ref 19–32)
Calcium: 10.2 mg/dL (ref 8.4–10.5)
Chloride: 101 mEq/L (ref 96–112)
Creatinine, Ser: 0.91 mg/dL (ref 0.40–1.20)
GFR: 68.99 mL/min (ref >60.00)
Glucose, Bld: 119 mg/dL — ABNORMAL HIGH (ref 70–99)
Potassium: 3.6 mEq/L (ref 3.5–5.1)
Sodium: 141 mEq/L (ref 135–145)

## 2021-12-07 NOTE — Progress Notes (Shared)
Subjective:   By signing my name below, I, Stacy Bond, attest that this documentation has been prepared under the direction and in the presence of [place], NP 12/07/2021     Patient ID: Stacy Bond, female    DOB: 26-Jul-1960, 61 y.o.   MRN: [id number]  No chief complaint on file.   HPI Patient is in today for an office visit  Health Maintenance Due  Topic Date Due   COVID-19 Vaccine (1) Never done   Zoster Vaccines- Shingrix (1 of 2) Never done   PAP SMEAR-Modifier  09/21/2018    Past Medical History:  Diagnosis Date   Anemia    Fibroid    Hemorrhoids    Hypertension     No past surgical history on file.  Family History  Problem Relation Age of Onset   Diabetes Father    Hypertension Mother    Pulmonary embolism Mother    Pulmonary embolism Brother    Clotting disorder Daughter     Social History   Socioeconomic History   Marital status: Married    Spouse name: Not on file   Number of children: 1   Years of education: Not on file   Highest education level: Not on file  Occupational History   Not on file  Tobacco Use   Smoking status: Never   Smokeless tobacco: Never  Vaping Use   Vaping Use: Never used  Substance and Sexual Activity   Alcohol use: Yes    Alcohol/week: 2.0 standard drinks of alcohol    Types: 2 Glasses of wine per week   Drug use: No   Sexual activity: Yes    Partners: Male  Other Topics Concern   Not on file  Social History Narrative   Not on file   Social Determinants of Health   Financial Resource Strain: Not on file  Food Insecurity: Not on file  Transportation Needs: Not on file  Physical Activity: Not on file  Stress: Not on file  Social Connections: Not on file  Intimate Partner Violence: Not on file    Outpatient Medications Prior to Visit  Medication Sig Dispense Refill   atorvastatin (LIPITOR) 20 MG tablet TAKE ONE TABLET BY MOUTH ONE TIME DAILY 90 tablet 0   cyclobenzaprine (FLEXERIL) 10 MG  tablet Take 1 tablet (10 mg total) by mouth at bedtime for 12 doses. 12 tablet 0   famciclovir (FAMVIR) 500 MG tablet Take 1 tablet (500 mg total) by mouth 3 (three) times daily. 21 tablet 0   olmesartan (BENICAR) 20 MG tablet Take 1 tablet (20 mg total) by mouth daily. 30 tablet 2   No facility-administered medications prior to visit.    No Known Allergies  ROS     Objective:    Physical Exam Constitutional:      General: She is not in acute distress.    Appearance: Normal appearance. She is not ill-appearing.  HENT:     Head: Normocephalic and atraumatic.     Right Ear: External ear normal.     Left Ear: External ear normal.  Eyes:     Extraocular Movements: Extraocular movements intact.     Pupils: Pupils are equal, round, and reactive to light.  Skin:    General: Skin is warm and dry.  Neurological:     Mental Status: She is alert and oriented to person, place, and time.  Psychiatric:        Mood and Affect: Mood normal.  Behavior: Behavior normal.        Judgment: Judgment normal.     There were no vitals taken for this visit. Wt Readings from Last 3 Encounters:  12/06/21 182 lb (82.6 kg)  11/30/21 182 lb 12.8 oz (82.9 kg)  11/28/21 169 lb (76.7 kg)       Assessment & Plan:   Problem List Items Addressed This Visit   None    No orders of the defined types were placed in this encounter.   I, Stacy Bond, personally preformed the services described in this documentation.  All medical record entries made by the scribe were at my direction and in my presence.  I have reviewed the chart and discharge instructions (if applicable) and agree that the record reflects my personal performance and is accurate and complete. 12/07/2021   I,Amber Collins,acting as a scribe for Nance Pear, NP.,have documented all relevant documentation on the behalf of Nance Pear, NP,as directed by  Nance Pear, NP while in the presence of Nance Pear, NP.  [employer]

## 2021-12-15 ENCOUNTER — Ambulatory Visit: Payer: No Typology Code available for payment source | Attending: Family Medicine

## 2021-12-15 DIAGNOSIS — M5413 Radiculopathy, cervicothoracic region: Secondary | ICD-10-CM | POA: Insufficient documentation

## 2021-12-15 DIAGNOSIS — M542 Cervicalgia: Secondary | ICD-10-CM | POA: Insufficient documentation

## 2021-12-15 DIAGNOSIS — M6281 Muscle weakness (generalized): Secondary | ICD-10-CM | POA: Diagnosis present

## 2021-12-15 DIAGNOSIS — M7918 Myalgia, other site: Secondary | ICD-10-CM | POA: Diagnosis not present

## 2021-12-15 DIAGNOSIS — M62830 Muscle spasm of back: Secondary | ICD-10-CM | POA: Diagnosis present

## 2021-12-15 DIAGNOSIS — S161XXA Strain of muscle, fascia and tendon at neck level, initial encounter: Secondary | ICD-10-CM | POA: Insufficient documentation
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
## 2021-12-19 ENCOUNTER — Ambulatory Visit: Payer: No Typology Code available for payment source

## 2021-12-19 DIAGNOSIS — M62830 Muscle spasm of back: Secondary | ICD-10-CM

## 2021-12-19 DIAGNOSIS — M5413 Radiculopathy, cervicothoracic region: Secondary | ICD-10-CM

## 2021-12-19 DIAGNOSIS — M6281 Muscle weakness (generalized): Secondary | ICD-10-CM

## 2021-12-19 DIAGNOSIS — M542 Cervicalgia: Secondary | ICD-10-CM | POA: Diagnosis not present
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
## 2021-12-21 ENCOUNTER — Ambulatory Visit: Payer: No Typology Code available for payment source

## 2021-12-21 DIAGNOSIS — M5413 Radiculopathy, cervicothoracic region: Secondary | ICD-10-CM

## 2021-12-21 DIAGNOSIS — M542 Cervicalgia: Secondary | ICD-10-CM

## 2021-12-21 DIAGNOSIS — M6281 Muscle weakness (generalized): Secondary | ICD-10-CM

## 2021-12-21 DIAGNOSIS — M62830 Muscle spasm of back: Secondary | ICD-10-CM
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
## 2021-12-26 ENCOUNTER — Ambulatory Visit: Payer: No Typology Code available for payment source | Admitting: Physical Therapy

## 2021-12-26 ENCOUNTER — Encounter: Payer: Self-pay | Admitting: Physical Therapy

## 2021-12-26 DIAGNOSIS — M542 Cervicalgia: Secondary | ICD-10-CM | POA: Diagnosis not present

## 2021-12-26 DIAGNOSIS — M62830 Muscle spasm of back: Secondary | ICD-10-CM

## 2021-12-26 DIAGNOSIS — M6281 Muscle weakness (generalized): Secondary | ICD-10-CM

## 2021-12-26 DIAGNOSIS — M5413 Radiculopathy, cervicothoracic region: Secondary | ICD-10-CM
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
## 2021-12-28 ENCOUNTER — Ambulatory Visit: Payer: No Typology Code available for payment source | Admitting: Physical Therapy

## 2021-12-28 ENCOUNTER — Encounter: Payer: Self-pay | Admitting: Physical Therapy

## 2021-12-28 DIAGNOSIS — M5413 Radiculopathy, cervicothoracic region: Secondary | ICD-10-CM

## 2021-12-28 DIAGNOSIS — M542 Cervicalgia: Secondary | ICD-10-CM

## 2021-12-28 DIAGNOSIS — M62830 Muscle spasm of back: Secondary | ICD-10-CM

## 2021-12-28 DIAGNOSIS — M6281 Muscle weakness (generalized): Secondary | ICD-10-CM
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
## 2022-01-02 ENCOUNTER — Ambulatory Visit: Payer: No Typology Code available for payment source | Admitting: Physical Therapy
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
## 2022-01-04 ENCOUNTER — Ambulatory Visit: Payer: No Typology Code available for payment source

## 2022-01-04 DIAGNOSIS — M5413 Radiculopathy, cervicothoracic region: Secondary | ICD-10-CM

## 2022-01-04 DIAGNOSIS — M6281 Muscle weakness (generalized): Secondary | ICD-10-CM

## 2022-01-04 DIAGNOSIS — M62830 Muscle spasm of back: Secondary | ICD-10-CM

## 2022-01-04 DIAGNOSIS — M542 Cervicalgia: Secondary | ICD-10-CM | POA: Diagnosis not present

## 2022-01-06 ENCOUNTER — Ambulatory Visit: Payer: No Typology Code available for payment source | Admitting: Physical Therapy

## 2022-01-06 DIAGNOSIS — M542 Cervicalgia: Secondary | ICD-10-CM | POA: Diagnosis not present

## 2022-01-06 DIAGNOSIS — M62830 Muscle spasm of back: Secondary | ICD-10-CM

## 2022-01-06 DIAGNOSIS — M6281 Muscle weakness (generalized): Secondary | ICD-10-CM
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
## 2022-01-09 ENCOUNTER — Encounter: Payer: Self-pay | Admitting: Physical Therapy

## 2022-01-09 ENCOUNTER — Ambulatory Visit: Payer: No Typology Code available for payment source | Admitting: Physical Therapy

## 2022-01-09 DIAGNOSIS — M5413 Radiculopathy, cervicothoracic region: Secondary | ICD-10-CM

## 2022-01-09 DIAGNOSIS — M542 Cervicalgia: Secondary | ICD-10-CM

## 2022-01-09 DIAGNOSIS — M62830 Muscle spasm of back: Secondary | ICD-10-CM

## 2022-01-09 DIAGNOSIS — M6281 Muscle weakness (generalized): Secondary | ICD-10-CM
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
## 2022-01-11 ENCOUNTER — Ambulatory Visit: Payer: No Typology Code available for payment source | Attending: Family Medicine

## 2022-01-11 DIAGNOSIS — M5413 Radiculopathy, cervicothoracic region: Secondary | ICD-10-CM | POA: Diagnosis present

## 2022-01-11 DIAGNOSIS — M6281 Muscle weakness (generalized): Secondary | ICD-10-CM | POA: Diagnosis present

## 2022-01-11 DIAGNOSIS — M62830 Muscle spasm of back: Secondary | ICD-10-CM | POA: Diagnosis present

## 2022-01-11 DIAGNOSIS — M542 Cervicalgia: Secondary | ICD-10-CM | POA: Diagnosis present

## 2022-01-16 ENCOUNTER — Telehealth: Payer: Self-pay | Admitting: Family Medicine

## 2022-01-16 ENCOUNTER — Encounter: Payer: Self-pay | Admitting: Family Medicine

## 2022-01-16 ENCOUNTER — Ambulatory Visit (INDEPENDENT_AMBULATORY_CARE_PROVIDER_SITE_OTHER): Payer: Self-pay | Admitting: Family Medicine

## 2022-01-16 ENCOUNTER — Ambulatory Visit: Payer: No Typology Code available for payment source

## 2022-01-16 VITALS — BP 148/108 | Ht 65.0 in | Wt 182.0 lb

## 2022-01-16 DIAGNOSIS — M5413 Radiculopathy, cervicothoracic region: Secondary | ICD-10-CM

## 2022-01-16 DIAGNOSIS — M542 Cervicalgia: Secondary | ICD-10-CM

## 2022-01-16 DIAGNOSIS — M6281 Muscle weakness (generalized): Secondary | ICD-10-CM

## 2022-01-16 DIAGNOSIS — M5412 Radiculopathy, cervical region: Secondary | ICD-10-CM | POA: Insufficient documentation

## 2022-01-16 DIAGNOSIS — M62830 Muscle spasm of back: Secondary | ICD-10-CM

## 2022-01-16 MED ORDER — MELOXICAM 15 MG PO TABS: 15.0000 mg | ORAL_TABLET | Freq: Every day | ORAL | 1 refills | Status: DC | PRN
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
## 2022-01-16 NOTE — Patient Instructions (Signed)
Good to see you Please use heat  Please use the mobic as needed We'll get the MRI at [facility] imaging   Please send me a message in [place] with any questions or updates.  We'll schedule a virtual visit once the MRI is resulted.   --Dr. Raeford Razor
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
## 2022-01-16 NOTE — Telephone Encounter (Signed)
Patient stopped by office after seeing physical therapy due to the high bp reading they got while in that visit. Patient states they advised her to follow up with PCP since her last two readings were 148/108 & 150/100. Patient would like a call back to know if she needs to change her medicine or not. Please advise.
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
## 2022-01-16 NOTE — Telephone Encounter (Signed)
Pt needs office visit please 

## 2022-01-17 ENCOUNTER — Ambulatory Visit (INDEPENDENT_AMBULATORY_CARE_PROVIDER_SITE_OTHER): Payer: Self-pay | Admitting: Family Medicine

## 2022-01-17 ENCOUNTER — Encounter: Payer: Self-pay | Admitting: Family Medicine

## 2022-01-17 VITALS — BP 152/100 | HR 85 | Temp 98.5°F | Resp 18 | Ht 65.0 in | Wt 179.0 lb

## 2022-01-17 DIAGNOSIS — I10 Essential (primary) hypertension: Secondary | ICD-10-CM

## 2022-01-17 MED ORDER — OLMESARTAN MEDOXOMIL 40 MG PO TABS: 40.0000 mg | ORAL_TABLET | Freq: Every day | ORAL | 2 refills | Status: DC
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
## 2022-01-17 NOTE — Patient Instructions (Signed)

## 2022-01-18 ENCOUNTER — Ambulatory Visit: Payer: No Typology Code available for payment source

## 2022-01-18 DIAGNOSIS — M542 Cervicalgia: Secondary | ICD-10-CM | POA: Diagnosis not present

## 2022-01-18 DIAGNOSIS — M62830 Muscle spasm of back: Secondary | ICD-10-CM

## 2022-01-18 DIAGNOSIS — M5413 Radiculopathy, cervicothoracic region: Secondary | ICD-10-CM

## 2022-01-18 DIAGNOSIS — M6281 Muscle weakness (generalized): Secondary | ICD-10-CM
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
## 2022-01-20 ENCOUNTER — Other Ambulatory Visit: Payer: Self-pay

## 2022-01-20 ENCOUNTER — Encounter (HOSPITAL_BASED_OUTPATIENT_CLINIC_OR_DEPARTMENT_OTHER): Payer: Self-pay

## 2022-01-20 ENCOUNTER — Emergency Department (HOSPITAL_BASED_OUTPATIENT_CLINIC_OR_DEPARTMENT_OTHER)
Admission: EM | Admit: 2022-01-20 | Discharge: 2022-01-20 | Disposition: A | Discharge status: Home / Self Care | Payer: Self-pay | Attending: Emergency Medicine | Admitting: Emergency Medicine

## 2022-01-20 ENCOUNTER — Other Ambulatory Visit: Payer: Self-pay | Admitting: Family Medicine

## 2022-01-20 ENCOUNTER — Telehealth: Payer: Self-pay | Admitting: Family Medicine

## 2022-01-20 DIAGNOSIS — I10 Essential (primary) hypertension: Secondary | ICD-10-CM | POA: Insufficient documentation

## 2022-01-20 DIAGNOSIS — Z79899 Other long term (current) drug therapy: Secondary | ICD-10-CM | POA: Diagnosis not present

## 2022-01-20 DIAGNOSIS — R42 Dizziness and giddiness: Secondary | ICD-10-CM | POA: Diagnosis present

## 2022-01-20 LAB — BASIC METABOLIC PANEL
Anion gap: 8 (ref 5–15)
BUN: 13 mg/dL (ref 6–20)
CO2: 30 mmol/L (ref 22–32)
Calcium: 9.7 mg/dL (ref 8.9–10.3)
Chloride: 102 mmol/L (ref 98–111)
Creatinine, Ser: 0.87 mg/dL (ref 0.44–1.00)
GFR, Estimated: >60 mL/min (ref >60)
Glucose, Bld: 120 mg/dL — ABNORMAL HIGH (ref 70–99)
Potassium: 3.7 mmol/L (ref 3.5–5.1)
Sodium: 140 mmol/L (ref 135–145)

## 2022-01-20 LAB — CBC
HCT: 40.2 % (ref 36.0–46.0)
Hemoglobin: 12.4 g/dL (ref 12.0–15.0)
MCH: 23.4 pg — ABNORMAL LOW (ref 26.0–34.0)
MCHC: 30.8 g/dL (ref 30.0–36.0)
MCV: 75.7 fL — ABNORMAL LOW (ref 80.0–100.0)
Platelets: 216 10*3/uL (ref 150–400)
RBC: 5.31 MIL/uL — ABNORMAL HIGH (ref 3.87–5.11)
RDW: 15.9 % — ABNORMAL HIGH (ref 11.5–15.5)
WBC: 7 10*3/uL (ref 4.0–10.5)
nRBC: 0 % (ref 0.0–0.2)

## 2022-01-20 LAB — URINALYSIS, ROUTINE W REFLEX MICROSCOPIC
Bilirubin Urine: NEGATIVE
Glucose, UA: NEGATIVE mg/dL
Hgb urine dipstick: NEGATIVE
Ketones, ur: NEGATIVE mg/dL
Leukocytes,Ua: NEGATIVE
Nitrite: NEGATIVE
Protein, ur: NEGATIVE mg/dL
Specific Gravity, Urine: 1.025 (ref 1.005–1.030)
pH: 5.5 (ref 5.0–8.0)

## 2022-01-20 LAB — CBG MONITORING, ED: Glucose-Capillary: 108 mg/dL — ABNORMAL HIGH (ref 70–99)

## 2022-01-20 LAB — PREGNANCY, URINE: Preg Test, Ur: NEGATIVE

## 2022-01-20 MED ORDER — OLMESARTAN MEDOXOMIL-HCTZ 40-25 MG PO TABS: 1.0000 | ORAL_TABLET | Freq: Every day | ORAL | 1 refills | Status: DC
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
## 2022-01-20 NOTE — Telephone Encounter (Signed)
Patient called to advise that her blood pressure is not going down. She said that it has gotten as high as 160/101. She would like to know if she should add a diruetic. She is concerned that it's not going down since doubling her dose Wednesday. Offered her an appointment but patient would like a return phone call for now.

## 2022-01-21 ENCOUNTER — Encounter (HOSPITAL_BASED_OUTPATIENT_CLINIC_OR_DEPARTMENT_OTHER): Payer: Self-pay

## 2022-01-21 ENCOUNTER — Ambulatory Visit (HOSPITAL_BASED_OUTPATIENT_CLINIC_OR_DEPARTMENT_OTHER)
Admission: RE | Admit: 2022-01-21 | Discharge: 2022-01-21 | Disposition: A | Discharge status: Home / Self Care | Payer: Self-pay | Source: Ambulatory Visit | Attending: Family Medicine | Admitting: Family Medicine

## 2022-01-21 DIAGNOSIS — M5412 Radiculopathy, cervical region: Secondary | ICD-10-CM

## 2022-01-21 DIAGNOSIS — R911 Solitary pulmonary nodule: Secondary | ICD-10-CM | POA: Insufficient documentation

## 2022-01-21 MED ORDER — IOHEXOL 300 MG/ML  SOLN
75.0000 mL | Freq: Once | INTRAMUSCULAR | Status: AC | PRN
  Administered 2022-01-21: 75 mL via INTRAVENOUS

## 2022-01-22 NOTE — Assessment & Plan Note (Signed)
Poorly controlled will alter medications, encouraged DASH diet, minimize caffeine and obtain adequate sleep. Report concerning symptoms and follow up as directed and as needed 

## 2022-01-23 ENCOUNTER — Encounter: Payer: Self-pay | Admitting: Family Medicine

## 2022-01-23 ENCOUNTER — Ambulatory Visit: Payer: No Typology Code available for payment source

## 2022-01-23 DIAGNOSIS — M6281 Muscle weakness (generalized): Secondary | ICD-10-CM

## 2022-01-23 DIAGNOSIS — M62830 Muscle spasm of back: Secondary | ICD-10-CM

## 2022-01-23 DIAGNOSIS — M542 Cervicalgia: Secondary | ICD-10-CM | POA: Diagnosis not present
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
## 2022-01-23 NOTE — Telephone Encounter (Signed)
Pt sent mychart message about this medication. Mychart sent to [facility]

## 2022-01-24 ENCOUNTER — Telehealth (INDEPENDENT_AMBULATORY_CARE_PROVIDER_SITE_OTHER): Payer: Self-pay | Admitting: Family Medicine

## 2022-01-24 ENCOUNTER — Encounter: Payer: Self-pay | Admitting: Family Medicine

## 2022-01-24 DIAGNOSIS — M5412 Radiculopathy, cervical region: Secondary | ICD-10-CM

## 2022-01-24 NOTE — Assessment & Plan Note (Signed)
Symptoms are still acutely occurring.  This affects her on a daily basis.  -Counseled on home exercise therapy and supportive care. -pursue epidural.
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
## 2022-01-25 ENCOUNTER — Ambulatory Visit: Payer: No Typology Code available for payment source

## 2022-01-25 DIAGNOSIS — M542 Cervicalgia: Secondary | ICD-10-CM

## 2022-01-25 DIAGNOSIS — M5413 Radiculopathy, cervicothoracic region: Secondary | ICD-10-CM

## 2022-01-25 DIAGNOSIS — M6281 Muscle weakness (generalized): Secondary | ICD-10-CM

## 2022-01-25 DIAGNOSIS — M62830 Muscle spasm of back: Secondary | ICD-10-CM
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
## 2022-01-25 NOTE — Telephone Encounter (Signed)
Pt originally took double the plain Benicar and didn't have a problem when she started the Benicar 40/25 she started experience the below sxs. Please advise

## 2022-01-27 ENCOUNTER — Other Ambulatory Visit: Payer: Self-pay

## 2022-01-30 ENCOUNTER — Ambulatory Visit: Payer: No Typology Code available for payment source | Admitting: Physical Therapy

## 2022-01-30 ENCOUNTER — Encounter: Payer: Self-pay | Admitting: Physical Therapy

## 2022-01-30 DIAGNOSIS — M542 Cervicalgia: Secondary | ICD-10-CM | POA: Diagnosis not present

## 2022-01-30 DIAGNOSIS — M62830 Muscle spasm of back: Secondary | ICD-10-CM

## 2022-01-30 DIAGNOSIS — M6281 Muscle weakness (generalized): Secondary | ICD-10-CM

## 2022-01-30 DIAGNOSIS — M5413 Radiculopathy, cervicothoracic region: Secondary | ICD-10-CM
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
## 2022-02-01 ENCOUNTER — Encounter: Payer: Self-pay | Admitting: Physical Therapy

## 2022-02-01 ENCOUNTER — Ambulatory Visit: Payer: No Typology Code available for payment source | Admitting: Physical Therapy

## 2022-02-01 DIAGNOSIS — M6281 Muscle weakness (generalized): Secondary | ICD-10-CM

## 2022-02-01 DIAGNOSIS — M542 Cervicalgia: Secondary | ICD-10-CM

## 2022-02-01 DIAGNOSIS — M62830 Muscle spasm of back: Secondary | ICD-10-CM

## 2022-02-01 DIAGNOSIS — M5413 Radiculopathy, cervicothoracic region: Secondary | ICD-10-CM
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
## 2022-02-08 ENCOUNTER — Ambulatory Visit: Payer: No Typology Code available for payment source

## 2022-02-08 DIAGNOSIS — M62830 Muscle spasm of back: Secondary | ICD-10-CM

## 2022-02-08 DIAGNOSIS — M542 Cervicalgia: Secondary | ICD-10-CM

## 2022-02-08 DIAGNOSIS — M5413 Radiculopathy, cervicothoracic region: Secondary | ICD-10-CM

## 2022-02-08 DIAGNOSIS — M6281 Muscle weakness (generalized): Secondary | ICD-10-CM

## 2022-02-10 ENCOUNTER — Ambulatory Visit: Payer: No Typology Code available for payment source | Attending: Family Medicine | Admitting: Physical Therapy

## 2022-02-10 ENCOUNTER — Encounter: Payer: Self-pay | Admitting: Physical Therapy

## 2022-02-10 DIAGNOSIS — M62830 Muscle spasm of back: Secondary | ICD-10-CM | POA: Diagnosis present

## 2022-02-10 DIAGNOSIS — M6281 Muscle weakness (generalized): Secondary | ICD-10-CM | POA: Insufficient documentation

## 2022-02-10 DIAGNOSIS — M542 Cervicalgia: Secondary | ICD-10-CM | POA: Insufficient documentation

## 2022-02-10 DIAGNOSIS — M5413 Radiculopathy, cervicothoracic region: Secondary | ICD-10-CM | POA: Diagnosis present
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
## 2022-02-14 ENCOUNTER — Ambulatory Visit: Payer: No Typology Code available for payment source | Admitting: Physical Therapy

## 2022-02-14 ENCOUNTER — Encounter: Payer: Self-pay | Admitting: Physical Therapy

## 2022-02-14 DIAGNOSIS — M542 Cervicalgia: Secondary | ICD-10-CM | POA: Diagnosis not present

## 2022-02-14 DIAGNOSIS — M5413 Radiculopathy, cervicothoracic region: Secondary | ICD-10-CM

## 2022-02-14 DIAGNOSIS — M6281 Muscle weakness (generalized): Secondary | ICD-10-CM

## 2022-02-14 DIAGNOSIS — M62830 Muscle spasm of back: Secondary | ICD-10-CM
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
## 2022-02-16 ENCOUNTER — Ambulatory Visit: Payer: No Typology Code available for payment source | Admitting: Physical Therapy

## 2022-02-20 ENCOUNTER — Ambulatory Visit: Payer: No Typology Code available for payment source | Admitting: Physical Therapy

## 2022-02-20 ENCOUNTER — Encounter: Payer: Self-pay | Admitting: Physical Therapy

## 2022-02-20 DIAGNOSIS — M6281 Muscle weakness (generalized): Secondary | ICD-10-CM

## 2022-02-20 DIAGNOSIS — M542 Cervicalgia: Secondary | ICD-10-CM

## 2022-02-20 DIAGNOSIS — M5413 Radiculopathy, cervicothoracic region: Secondary | ICD-10-CM

## 2022-02-20 DIAGNOSIS — M62830 Muscle spasm of back: Secondary | ICD-10-CM
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
## 2022-02-22 ENCOUNTER — Encounter: Payer: Self-pay | Admitting: Physical Therapy

## 2022-02-22 ENCOUNTER — Ambulatory Visit: Payer: No Typology Code available for payment source | Admitting: Physical Therapy

## 2022-02-22 DIAGNOSIS — M62830 Muscle spasm of back: Secondary | ICD-10-CM

## 2022-02-22 DIAGNOSIS — M5413 Radiculopathy, cervicothoracic region: Secondary | ICD-10-CM

## 2022-02-22 DIAGNOSIS — M542 Cervicalgia: Secondary | ICD-10-CM | POA: Diagnosis not present

## 2022-02-22 DIAGNOSIS — M6281 Muscle weakness (generalized): Secondary | ICD-10-CM

## 2022-02-27 ENCOUNTER — Encounter: Payer: Self-pay | Admitting: Physical Therapy

## 2022-02-27 ENCOUNTER — Ambulatory Visit: Payer: No Typology Code available for payment source | Admitting: Physical Therapy

## 2022-02-27 DIAGNOSIS — M62830 Muscle spasm of back: Secondary | ICD-10-CM

## 2022-02-27 DIAGNOSIS — M6281 Muscle weakness (generalized): Secondary | ICD-10-CM

## 2022-02-27 DIAGNOSIS — M5413 Radiculopathy, cervicothoracic region: Secondary | ICD-10-CM

## 2022-02-27 DIAGNOSIS — M542 Cervicalgia: Secondary | ICD-10-CM
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
## 2022-03-01 ENCOUNTER — Ambulatory Visit: Payer: No Typology Code available for payment source

## 2022-03-01 DIAGNOSIS — M5413 Radiculopathy, cervicothoracic region: Secondary | ICD-10-CM

## 2022-03-01 DIAGNOSIS — M542 Cervicalgia: Secondary | ICD-10-CM

## 2022-03-01 DIAGNOSIS — M62830 Muscle spasm of back: Secondary | ICD-10-CM

## 2022-03-01 DIAGNOSIS — M6281 Muscle weakness (generalized): Secondary | ICD-10-CM
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
## 2022-03-06 ENCOUNTER — Ambulatory Visit: Payer: No Typology Code available for payment source
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
## 2022-03-08 ENCOUNTER — Ambulatory Visit: Payer: No Typology Code available for payment source

## 2022-03-08 DIAGNOSIS — M542 Cervicalgia: Secondary | ICD-10-CM

## 2022-03-08 DIAGNOSIS — M5413 Radiculopathy, cervicothoracic region: Secondary | ICD-10-CM

## 2022-03-08 DIAGNOSIS — M6281 Muscle weakness (generalized): Secondary | ICD-10-CM

## 2022-03-08 DIAGNOSIS — M62830 Muscle spasm of back: Secondary | ICD-10-CM

## 2022-03-15 ENCOUNTER — Encounter: Payer: Self-pay | Admitting: Physical Therapy

## 2022-03-15 ENCOUNTER — Ambulatory Visit: Payer: No Typology Code available for payment source | Attending: Family Medicine | Admitting: Physical Therapy

## 2022-03-15 DIAGNOSIS — M62830 Muscle spasm of back: Secondary | ICD-10-CM | POA: Diagnosis present

## 2022-03-15 DIAGNOSIS — M5413 Radiculopathy, cervicothoracic region: Secondary | ICD-10-CM | POA: Insufficient documentation

## 2022-03-15 DIAGNOSIS — M6281 Muscle weakness (generalized): Secondary | ICD-10-CM | POA: Diagnosis present

## 2022-03-15 DIAGNOSIS — M542 Cervicalgia: Secondary | ICD-10-CM | POA: Insufficient documentation
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
## 2022-03-21 ENCOUNTER — Ambulatory Visit: Payer: No Typology Code available for payment source

## 2022-03-21 DIAGNOSIS — M5413 Radiculopathy, cervicothoracic region: Secondary | ICD-10-CM

## 2022-03-21 DIAGNOSIS — M542 Cervicalgia: Secondary | ICD-10-CM

## 2022-03-21 DIAGNOSIS — M6281 Muscle weakness (generalized): Secondary | ICD-10-CM

## 2022-03-21 DIAGNOSIS — M62830 Muscle spasm of back: Secondary | ICD-10-CM
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
## 2022-04-04 ENCOUNTER — Encounter: Payer: Self-pay | Admitting: Gastroenterology

## 2022-04-24 ENCOUNTER — Other Ambulatory Visit: Payer: Self-pay | Admitting: Family Medicine

## 2022-04-24 DIAGNOSIS — I10 Essential (primary) hypertension: Secondary | ICD-10-CM

## 2022-04-24 DIAGNOSIS — E785 Hyperlipidemia, unspecified: Secondary | ICD-10-CM

## 2022-05-21 IMAGING — DX DG CHEST 1V PORT
1 series · 1 of 1 positions shown · non-contrast
Comparison: 02/18/2019

CLINICAL DATA: Chest pain

EXAM:
PORTABLE CHEST 1 VIEW

[chest ap]
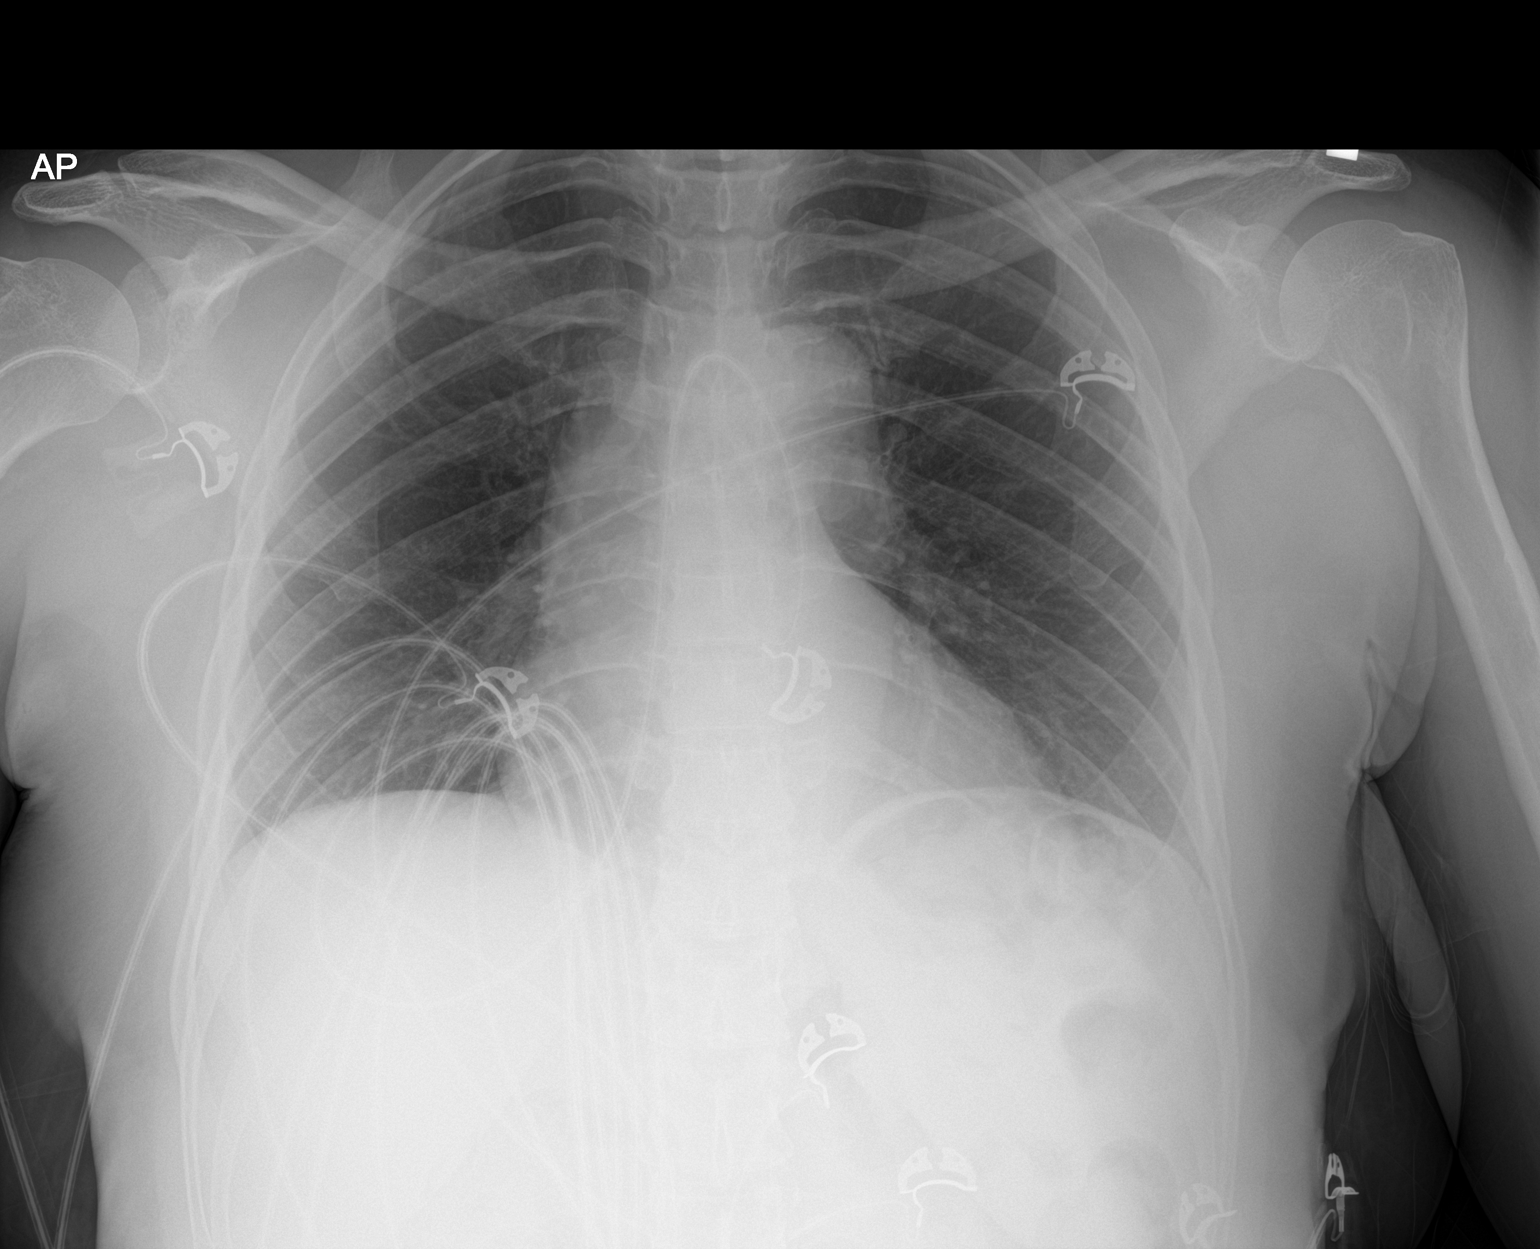

[1 of 1 positions shown; findings below may reference images not displayed]

FINDINGS: Heart and mediastinal contours are within normal limits. No focal
opacities or effusions. No acute bony abnormality.
IMPRESSION: No active disease.

## 2022-05-26 ENCOUNTER — Other Ambulatory Visit: Payer: Self-pay | Admitting: Family Medicine

## 2022-05-26 DIAGNOSIS — E785 Hyperlipidemia, unspecified: Secondary | ICD-10-CM

## 2022-07-23 ENCOUNTER — Other Ambulatory Visit: Payer: Self-pay | Admitting: Family Medicine

## 2022-07-23 DIAGNOSIS — I10 Essential (primary) hypertension: Secondary | ICD-10-CM

## 2022-08-22 ENCOUNTER — Other Ambulatory Visit: Payer: Self-pay | Admitting: Family Medicine

## 2022-08-22 DIAGNOSIS — I10 Essential (primary) hypertension: Secondary | ICD-10-CM

## 2022-08-29 ENCOUNTER — Ambulatory Visit: Payer: BLUE CROSS/BLUE SHIELD | Admitting: Family Medicine

## 2022-08-29 ENCOUNTER — Encounter: Payer: Self-pay | Admitting: Family Medicine

## 2022-08-29 VITALS — BP 136/100 | HR 83 | Temp 97.9°F | Resp 18 | Ht 65.0 in | Wt 182.4 lb

## 2022-08-29 DIAGNOSIS — E782 Mixed hyperlipidemia: Secondary | ICD-10-CM | POA: Diagnosis not present

## 2022-08-29 DIAGNOSIS — S134XXD Sprain of ligaments of cervical spine, subsequent encounter: Secondary | ICD-10-CM

## 2022-08-29 DIAGNOSIS — I10 Essential (primary) hypertension: Secondary | ICD-10-CM

## 2022-08-29 LAB — COMPREHENSIVE METABOLIC PANEL
ALT: 21 U/L (ref 0–35)
AST: 26 U/L (ref 0–37)
Albumin: 4.4 g/dL (ref 3.5–5.2)
Alkaline Phosphatase: 80 U/L (ref 39–117)
BUN: 14 mg/dL (ref 6–23)
CO2: 28 mEq/L (ref 19–32)
Calcium: 9.7 mg/dL (ref 8.4–10.5)
Chloride: 102 mEq/L (ref 96–112)
Creatinine, Ser: 0.87 mg/dL (ref 0.40–1.20)
GFR: 72.45 mL/min (ref >60.00)
Glucose, Bld: 97 mg/dL (ref 70–99)
Potassium: 4 mEq/L (ref 3.5–5.1)
Sodium: 139 mEq/L (ref 135–145)
Total Bilirubin: 0.7 mg/dL (ref 0.2–1.2)
Total Protein: 7.5 g/dL (ref 6.0–8.3)

## 2022-08-29 LAB — TSH: TSH: 1.02 u[IU]/mL (ref 0.35–5.50)

## 2022-08-29 LAB — CBC WITH DIFFERENTIAL/PLATELET
Basophils Absolute: 0 10*3/uL (ref 0.0–0.1)
Basophils Relative: 0.5 % (ref 0.0–3.0)
Eosinophils Absolute: 0.2 10*3/uL (ref 0.0–0.7)
Eosinophils Relative: 2.7 % (ref 0.0–5.0)
HCT: 37.3 % (ref 36.0–46.0)
Hemoglobin: 11.7 g/dL — ABNORMAL LOW (ref 12.0–15.0)
Lymphocytes Relative: 26 % (ref 12.0–46.0)
Lymphs Abs: 1.9 10*3/uL (ref 0.7–4.0)
MCHC: 31.4 g/dL (ref 30.0–36.0)
MCV: 73.6 fl — ABNORMAL LOW (ref 78.0–100.0)
Monocytes Absolute: 0.4 10*3/uL (ref 0.1–1.0)
Monocytes Relative: 5.1 % (ref 3.0–12.0)
Neutro Abs: 4.7 10*3/uL (ref 1.4–7.7)
Neutrophils Relative %: 65.7 % (ref 43.0–77.0)
Platelets: 199 10*3/uL (ref 150.0–400.0)
RBC: 5.07 Mil/uL (ref 3.87–5.11)
RDW: 15.7 % — ABNORMAL HIGH (ref 11.5–15.5)
WBC: 7.1 10*3/uL (ref 4.0–10.5)

## 2022-08-29 LAB — LIPID PANEL
Cholesterol: 192 mg/dL (ref 0–200)
HDL: 67.9 mg/dL (ref >39.00)
LDL Cholesterol: 109 mg/dL — ABNORMAL HIGH (ref 0–99)
NonHDL: 124.42
Total CHOL/HDL Ratio: 3
Triglycerides: 79 mg/dL (ref 0.0–149.0)
VLDL: 15.8 mg/dL (ref 0.0–40.0)

## 2022-08-29 MED ORDER — MELOXICAM 15 MG PO TABS: 15.0000 mg | ORAL_TABLET | Freq: Every day | ORAL | 1 refills | Status: DC | PRN

## 2022-08-29 MED ORDER — AMLODIPINE BESYLATE 2.5 MG PO TABS: 2.5000 mg | ORAL_TABLET | Freq: Every day | ORAL | 3 refills | Status: DC

## 2022-08-29 MED ORDER — METHOCARBAMOL 500 MG PO TABS: 500.0000 mg | ORAL_TABLET | Freq: Four times a day (QID) | ORAL | 2 refills | Status: DC
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
## 2022-08-29 NOTE — Assessment & Plan Note (Signed)
Well controlled, no changes to meds. Encouraged heart healthy diet such as the DASH diet and exercise as tolerated.  °

## 2022-08-29 NOTE — Patient Instructions (Signed)

## 2022-08-29 NOTE — Assessment & Plan Note (Signed)
Tolerating statin, encouraged heart healthy diet, avoid trans fats, minimize simple carbs and saturated fats. Increase exercise as tolerated 

## 2022-09-01 ENCOUNTER — Encounter: Payer: Self-pay | Admitting: Family Medicine

## 2022-09-25 ENCOUNTER — Encounter: Payer: Self-pay | Admitting: *Deleted

## 2022-09-25 ENCOUNTER — Ambulatory Visit: Payer: BLUE CROSS/BLUE SHIELD | Admitting: Family Medicine

## 2022-09-27 ENCOUNTER — Ambulatory Visit (INDEPENDENT_AMBULATORY_CARE_PROVIDER_SITE_OTHER): Payer: BLUE CROSS/BLUE SHIELD | Admitting: Family Medicine

## 2022-09-27 ENCOUNTER — Other Ambulatory Visit: Payer: Self-pay | Admitting: Family Medicine

## 2022-09-27 DIAGNOSIS — I10 Essential (primary) hypertension: Secondary | ICD-10-CM | POA: Diagnosis not present

## 2022-09-27 NOTE — Progress Notes (Signed)
Pt here for Blood pressure check per Dr. Zola Button   Pt currently takes: Amlodipine 2.5mg  , Olmesartan    Pt reports compliance with medication.  BP today @ =128/82 HR = 70  Pt advised per Dr. Carmelia Roller to continue with same medications and follow up with PCP in 6 months for CPE.

## 2022-10-09 ENCOUNTER — Encounter: Payer: Self-pay | Admitting: Family Medicine

## 2022-10-10 ENCOUNTER — Other Ambulatory Visit (HOSPITAL_BASED_OUTPATIENT_CLINIC_OR_DEPARTMENT_OTHER): Payer: Self-pay | Admitting: Family Medicine

## 2022-10-10 DIAGNOSIS — Z1231 Encounter for screening mammogram for malignant neoplasm of breast: Secondary | ICD-10-CM

## 2022-10-16 ENCOUNTER — Encounter (HOSPITAL_BASED_OUTPATIENT_CLINIC_OR_DEPARTMENT_OTHER): Payer: Self-pay

## 2022-10-16 ENCOUNTER — Ambulatory Visit (HOSPITAL_BASED_OUTPATIENT_CLINIC_OR_DEPARTMENT_OTHER)
Admission: RE | Admit: 2022-10-16 | Discharge: 2022-10-16 | Disposition: A | Discharge status: Home / Self Care | Payer: BLUE CROSS/BLUE SHIELD | Source: Ambulatory Visit | Attending: Family Medicine | Admitting: Family Medicine

## 2022-10-16 DIAGNOSIS — Z1231 Encounter for screening mammogram for malignant neoplasm of breast: Secondary | ICD-10-CM | POA: Insufficient documentation

## 2022-10-27 ENCOUNTER — Other Ambulatory Visit: Payer: Self-pay | Admitting: Family Medicine

## 2022-10-27 DIAGNOSIS — E785 Hyperlipidemia, unspecified: Secondary | ICD-10-CM

## 2022-10-27 DIAGNOSIS — I10 Essential (primary) hypertension: Secondary | ICD-10-CM

## 2022-11-13 ENCOUNTER — Encounter: Payer: Self-pay | Admitting: Family Medicine

## 2022-12-04 ENCOUNTER — Other Ambulatory Visit: Payer: Self-pay | Admitting: Family Medicine

## 2022-12-04 DIAGNOSIS — I10 Essential (primary) hypertension: Secondary | ICD-10-CM

## 2023-01-11 ENCOUNTER — Other Ambulatory Visit: Payer: Self-pay | Admitting: Family Medicine

## 2023-01-11 DIAGNOSIS — R911 Solitary pulmonary nodule: Secondary | ICD-10-CM

## 2023-01-19 ENCOUNTER — Other Ambulatory Visit: Payer: Self-pay | Admitting: Family Medicine

## 2023-01-19 DIAGNOSIS — I10 Essential (primary) hypertension: Secondary | ICD-10-CM

## 2023-01-25 ENCOUNTER — Encounter (INDEPENDENT_AMBULATORY_CARE_PROVIDER_SITE_OTHER): Payer: Self-pay

## 2023-02-13 ENCOUNTER — Encounter: Payer: BLUE CROSS/BLUE SHIELD | Admitting: Family Medicine

## 2023-03-05 ENCOUNTER — Other Ambulatory Visit: Payer: Self-pay | Admitting: Family Medicine

## 2023-03-05 DIAGNOSIS — I10 Essential (primary) hypertension: Secondary | ICD-10-CM

## 2023-03-13 ENCOUNTER — Ambulatory Visit: Payer: BLUE CROSS/BLUE SHIELD | Admitting: Family Medicine

## 2023-04-12 ENCOUNTER — Encounter: Payer: Self-pay | Admitting: Family Medicine

## 2023-04-12 ENCOUNTER — Ambulatory Visit (INDEPENDENT_AMBULATORY_CARE_PROVIDER_SITE_OTHER): Payer: BLUE CROSS/BLUE SHIELD | Admitting: Family Medicine

## 2023-04-12 VITALS — BP 122/80 | HR 91 | Temp 98.5°F | Resp 18 | Ht 65.0 in | Wt 173.0 lb

## 2023-04-12 DIAGNOSIS — E782 Mixed hyperlipidemia: Secondary | ICD-10-CM

## 2023-04-12 DIAGNOSIS — Z0000 Encounter for general adult medical examination without abnormal findings: Secondary | ICD-10-CM

## 2023-04-12 DIAGNOSIS — Z1211 Encounter for screening for malignant neoplasm of colon: Secondary | ICD-10-CM

## 2023-04-12 DIAGNOSIS — I10 Essential (primary) hypertension: Secondary | ICD-10-CM | POA: Diagnosis not present

## 2023-04-12 MED ORDER — AMLODIPINE BESYLATE 5 MG PO TABS
5.0000 mg | ORAL_TABLET | Freq: Every day | ORAL | 3 refills | Status: AC
Start: 2023-04-12 — End: ?

## 2023-04-12 NOTE — Progress Notes (Signed)
Established Patient Office Visit  Subjective   Patient ID: Stacy Bond, female    DOB: 1960/08/22  Age: 62 y.o. MRN: [id number]  Chief Complaint  Patient presents with   Annual Exam    Pt states states not fasting     HPI Discussed the use of AI scribe software for clinical note transcription with the patient, who gave verbal consent to proceed.  History of Present Illness   The patient, with a history of hypertension managed with amlodipine, presents for a routine physical examination. She reports doubling her amlodipine dosage, which has made a significant difference in her condition. She has no new complaints and appears to be managing her health well. The patient also mentions a previous episode of shingles, expressing fear about the vaccine due to past experiences with vaccines making her feel unwell. She had a colonoscopy seven years ago and is due for another one, but expresses dislike for the preparation process.      Patient Active Problem List   Diagnosis Date Noted   Preventative health care 04/12/2023   Cervical radiculopathy 01/16/2022   Cervical strain 12/06/2021   Myofascial pain syndrome of thoracic spine 12/06/2021   Essential hypertension 08/06/2018   Tooth abscess 08/06/2018   Hyperlipidemia 09/01/2017   Slow transit constipation 08/31/2017   Fibroid 03/14/2017   Chest pain 04/15/2013   Chronic serous otitis media 03/20/2011   Seasonal allergies 03/16/2011   Past Medical History:  Diagnosis Date   Anemia    Fibroid    Hemorrhoids    Hypertension    No past surgical history on file. Social History   Tobacco Use   Smoking status: Never   Smokeless tobacco: Never  Vaping Use   Vaping status: Never Used  Substance Use Topics   Alcohol use: Yes    Alcohol/week: 2.0 standard drinks of alcohol    Types: 2 Glasses of wine per week   Drug use: No   Social History   Socioeconomic History   Marital status: Married    Spouse name: Not on file    Number of children: 1   Years of education: Not on file   Highest education level: Not on file  Occupational History   Not on file  Tobacco Use   Smoking status: Never   Smokeless tobacco: Never  Vaping Use   Vaping status: Never Used  Substance and Sexual Activity   Alcohol use: Yes    Alcohol/week: 2.0 standard drinks of alcohol    Types: 2 Glasses of wine per week   Drug use: No   Sexual activity: Yes    Partners: Male  Other Topics Concern   Not on file  Social History Narrative   Not on file   Social Determinants of Health   Financial Resource Strain: Not on file  Food Insecurity: Not on file  Transportation Needs: Not on file  Physical Activity: Not on file  Stress: Not on file  Social Connections: Not on file  Intimate Partner Violence: Not on file   Family Status  Relation Name Status   Father  Alive   Mother  Deceased   Brother  Deceased       accident   Daughter  Alive  No partnership data on file   Family History  Problem Relation Age of Onset   Diabetes Father    Hypertension Mother    Pulmonary embolism Mother    Pulmonary embolism Brother    Clotting disorder Daughter  No Known Allergies    Review of Systems  Constitutional:  Negative for chills, fever and malaise/fatigue.  HENT:  Negative for congestion and hearing loss.   Eyes:  Negative for blurred vision and discharge.  Respiratory:  Negative for cough, sputum production and shortness of breath.   Cardiovascular:  Negative for chest pain, palpitations and leg swelling.  Gastrointestinal:  Negative for abdominal pain, blood in stool, constipation, diarrhea, heartburn, nausea and vomiting.  Genitourinary:  Negative for dysuria, frequency, hematuria and urgency.  Musculoskeletal:  Negative for back pain, falls and myalgias.  Skin:  Negative for rash.  Neurological:  Negative for dizziness, sensory change, loss of consciousness, weakness and headaches.  Endo/Heme/Allergies:  Negative for  environmental allergies. Does not bruise/bleed easily.  Psychiatric/Behavioral:  Negative for depression and suicidal ideas. The patient is not nervous/anxious and does not have insomnia.       Objective:     BP 122/80 (BP Location: Right Arm, Patient Position: Sitting, Cuff Size: Normal)   Pulse 91   Temp 98.5 F (36.9 C) (Oral)   Resp 18   Ht 5\' 5"  (1.651 m)   Wt 173 lb (78.5 kg)   SpO2 98%   BMI 28.79 kg/m  BP Readings from Last 3 Encounters:  04/12/23 122/80  08/29/22 (!) 136/100  01/20/22 129/89   Wt Readings from Last 3 Encounters:  04/12/23 173 lb (78.5 kg)  08/29/22 182 lb 6.4 oz (82.7 kg)  01/24/22 179 lb (81.2 kg)   SpO2 Readings from Last 3 Encounters:  04/12/23 98%  08/29/22 98%  01/20/22 100%      Physical Exam Vitals and nursing note reviewed.  Constitutional:      General: She is not in acute distress.    Appearance: Normal appearance. She is well-developed.  HENT:     Head: Normocephalic and atraumatic.     Right Ear: Tympanic membrane, ear canal and external ear normal. There is no impacted cerumen.     Left Ear: Tympanic membrane, ear canal and external ear normal. There is no impacted cerumen.     Nose: Nose normal.     Mouth/Throat:     Mouth: Mucous membranes are moist.     Pharynx: Oropharynx is clear. No oropharyngeal exudate or posterior oropharyngeal erythema.  Eyes:     General: No scleral icterus.       Right eye: No discharge.        Left eye: No discharge.     Conjunctiva/sclera: Conjunctivae normal.     Pupils: Pupils are equal, round, and reactive to light.  Neck:     Thyroid: No thyromegaly or thyroid tenderness.     Vascular: No JVD.  Cardiovascular:     Rate and Rhythm: Normal rate and regular rhythm.     Heart sounds: Normal heart sounds. No murmur heard. Pulmonary:     Effort: Pulmonary effort is normal. No respiratory distress.     Breath sounds: Normal breath sounds.  Abdominal:     General: Bowel sounds are normal.  There is no distension.     Palpations: Abdomen is soft. There is no mass.     Tenderness: There is no abdominal tenderness. There is no guarding or rebound.  Genitourinary:    Vagina: Normal.  Musculoskeletal:        General: Normal range of motion.     Cervical back: Normal range of motion and neck supple.     Right lower leg: No edema.  Left lower leg: No edema.  Lymphadenopathy:     Cervical: No cervical adenopathy.  Skin:    General: Skin is warm and dry.     Findings: No erythema or rash.  Neurological:     General: No focal deficit present.     Mental Status: She is alert and oriented to person, place, and time.     Cranial Nerves: No cranial nerve deficit.     Deep Tendon Reflexes: Reflexes are normal and symmetric.  Psychiatric:        Mood and Affect: Mood normal.        Behavior: Behavior normal.        Thought Content: Thought content normal.        Judgment: Judgment normal.      No results found for any visits on 04/12/23.  Last CBC Lab Results  Component Value Date   WBC 7.1 08/29/2022   HGB 11.7 (L) 08/29/2022   HCT 37.3 08/29/2022   MCV 73.6 (L) 08/29/2022   MCH 23.4 (L) 01/20/2022   RDW 15.7 (H) 08/29/2022   PLT 199.0 08/29/2022   Last metabolic panel Lab Results  Component Value Date   GLUCOSE 97 08/29/2022   NA 139 08/29/2022   K 4.0 08/29/2022   CL 102 08/29/2022   CO2 28 08/29/2022   BUN 14 08/29/2022   CREATININE 0.87 08/29/2022   GFR 72.45 08/29/2022   CALCIUM 9.7 08/29/2022   PROT 7.5 08/29/2022   ALBUMIN 4.4 08/29/2022   BILITOT 0.7 08/29/2022   ALKPHOS 80 08/29/2022   AST 26 08/29/2022   ALT 21 08/29/2022   ANIONGAP 8 01/20/2022   Last lipids Lab Results  Component Value Date   CHOL 192 08/29/2022   HDL 67.90 08/29/2022   LDLCALC 109 (H) 08/29/2022   LDLDIRECT 192.0 02/22/2017   TRIG 79.0 08/29/2022   CHOLHDL 3 08/29/2022   Last hemoglobin A1c No results found for: "HGBA1C" Last thyroid functions Lab Results   Component Value Date   TSH 1.02 08/29/2022   Last vitamin D No results found for: "25OHVITD2", "25OHVITD3", "VD25OH" Last vitamin B12 and Folate No results found for: "VITAMINB12", "FOLATE"    The 10-year ASCVD risk score (Arnett DK, et al., 2019) is: 5.1%    Assessment & Plan:   Problem List Items Addressed This Visit       Unprioritized   Preventative health care - Primary    Ghm utd Check labs  See AVS Health Maintenance  Topic Date Due   Zoster Vaccines- Shingrix (1 of 2) Never done   Cervical Cancer Screening (HPV/Pap Cotest)  09/21/2018   Colonoscopy  02/26/2022   COVID-19 Vaccine (1 - 2023-24 season) 04/28/2023 (Originally 02/11/2023)   INFLUENZA VACCINE  09/10/2023 (Originally 01/11/2023)   DTaP/Tdap/Td (2 - Td or Tdap) 08/09/2024   MAMMOGRAM  10/15/2024   Hepatitis C Screening  Completed   HIV Screening  Completed   HPV VACCINES  Aged Out         Relevant Orders   Comprehensive metabolic panel   Lipid panel   TSH   Hyperlipidemia    Encourage heart healthy diet such as MIND or DASH diet, increase exercise, avoid trans fats, simple carbohydrates and processed foods, consider a krill or fish or flaxseed oil cap daily.        Relevant Medications   amLODipine (NORVASC) 5 MG tablet   Other Relevant Orders   CBC with Differential/Platelet   Comprehensive metabolic panel   Lipid panel  Essential hypertension    Well controlled, no changes to meds. Encouraged heart healthy diet such as the DASH diet and exercise as tolerated.        Relevant Medications   amLODipine (NORVASC) 5 MG tablet   Other Relevant Orders   CBC with Differential/Platelet   Comprehensive metabolic panel   Lipid panel   Other Visit Diagnoses     Colon cancer screening       Relevant Orders   Ambulatory referral to Gastroenterology     Assessment and Plan    Hypertension Well controlled on Amlodipine 2.5mg  twice daily. Patient reports significant improvement. -Change  Amlodipine prescription to 5mg  daily for ease of administration. -Send prescription to [facility] for 90-day supply.  Cervical Cancer Screening Last Pap smear in 2017. No current gynecological complaints. -Schedule Pap smear within the next two weeks.  Shingles Vaccination Patient has a history of shingles but is hesitant about the vaccine due to previous adverse reactions to vaccines. -Discuss benefits and risks of Shingles vaccine upon patient's return from travel.  Colon Cancer Screening Last colonoscopy in 2013. No current gastrointestinal complaints. -Refer to gastroenterology for colonoscopy scheduling upon patient's return from travel.  General Health Maintenance -Continue regular eye and dental check-ups. -Consider discussing COVID-19 booster vaccination upon patient's return from travel.        Return in about 2 weeks (around 04/26/2023) for pap.    Donato Schultz, DO

## 2023-04-12 NOTE — Assessment & Plan Note (Signed)
Well controlled, no changes to meds. Encouraged heart healthy diet such as the DASH diet and exercise as tolerated.  °

## 2023-04-12 NOTE — Assessment & Plan Note (Signed)
Ghm utd Check labs  See AVS Health Maintenance  Topic Date Due   Zoster Vaccines- Shingrix (1 of 2) Never done   Cervical Cancer Screening (HPV/Pap Cotest)  09/21/2018   Colonoscopy  02/26/2022   COVID-19 Vaccine (1 - 2023-24 season) 04/28/2023 (Originally 02/11/2023)   INFLUENZA VACCINE  09/10/2023 (Originally 01/11/2023)   DTaP/Tdap/Td (2 - Td or Tdap) 08/09/2024   MAMMOGRAM  10/15/2024   Hepatitis C Screening  Completed   HIV Screening  Completed   HPV VACCINES  Aged Out

## 2023-04-12 NOTE — Assessment & Plan Note (Signed)
Encourage heart healthy diet such as MIND or DASH diet, increase exercise, avoid trans fats, simple carbohydrates and processed foods, consider a krill or fish or flaxseed oil cap daily.  °

## 2023-04-13 LAB — CBC WITH DIFFERENTIAL/PLATELET
Basophils Absolute: 0 10*3/uL (ref 0.0–0.1)
Basophils Relative: 0.6 % (ref 0.0–3.0)
Eosinophils Absolute: 0.2 10*3/uL (ref 0.0–0.7)
Eosinophils Relative: 3.5 % (ref 0.0–5.0)
HCT: 39.8 % (ref 36.0–46.0)
Hemoglobin: 12.2 g/dL (ref 12.0–15.0)
Lymphocytes Relative: 26.3 % (ref 12.0–46.0)
Lymphs Abs: 1.8 10*3/uL (ref 0.7–4.0)
MCHC: 30.7 g/dL (ref 30.0–36.0)
MCV: 74.9 fL — ABNORMAL LOW (ref 78.0–100.0)
Monocytes Absolute: 0.6 10*3/uL (ref 0.1–1.0)
Monocytes Relative: 8.5 % (ref 3.0–12.0)
Neutro Abs: 4.2 10*3/uL (ref 1.4–7.7)
Neutrophils Relative %: 61.1 % (ref 43.0–77.0)
Platelets: 232 10*3/uL (ref 150.0–400.0)
RBC: 5.32 Mil/uL — ABNORMAL HIGH (ref 3.87–5.11)
RDW: 15.4 % (ref 11.5–15.5)
WBC: 7 10*3/uL (ref 4.0–10.5)

## 2023-04-13 LAB — COMPREHENSIVE METABOLIC PANEL
ALT: 19 U/L (ref 0–35)
AST: 21 U/L (ref 0–37)
Albumin: 4.7 g/dL (ref 3.5–5.2)
Alkaline Phosphatase: 89 U/L (ref 39–117)
BUN: 24 mg/dL — ABNORMAL HIGH (ref 6–23)
CO2: 34 meq/L — ABNORMAL HIGH (ref 19–32)
Calcium: 10.2 mg/dL (ref 8.4–10.5)
Chloride: 97 meq/L (ref 96–112)
Creatinine, Ser: 1.3 mg/dL — ABNORMAL HIGH (ref 0.40–1.20)
GFR: 44.55 mL/min — ABNORMAL LOW (ref >60.00)
Glucose, Bld: 96 mg/dL (ref 70–99)
Potassium: 4 meq/L (ref 3.5–5.1)
Sodium: 140 meq/L (ref 135–145)
Total Bilirubin: 0.8 mg/dL (ref 0.2–1.2)
Total Protein: 7.9 g/dL (ref 6.0–8.3)

## 2023-04-13 LAB — LIPID PANEL
Cholesterol: 211 mg/dL — ABNORMAL HIGH (ref 0–200)
HDL: 67.6 mg/dL (ref >39.00)
LDL Cholesterol: 123 mg/dL — ABNORMAL HIGH (ref 0–99)
NonHDL: 143.3
Total CHOL/HDL Ratio: 3
Triglycerides: 103 mg/dL (ref 0.0–149.0)
VLDL: 20.6 mg/dL (ref 0.0–40.0)

## 2023-04-13 LAB — TSH: TSH: 0.79 u[IU]/mL (ref 0.35–5.50)

## 2023-04-26 ENCOUNTER — Ambulatory Visit: Payer: BLUE CROSS/BLUE SHIELD | Admitting: Family Medicine

## 2023-04-27 ENCOUNTER — Other Ambulatory Visit: Payer: Self-pay | Admitting: Family Medicine

## 2023-04-27 ENCOUNTER — Telehealth: Payer: Self-pay | Admitting: Family Medicine

## 2023-04-27 DIAGNOSIS — I10 Essential (primary) hypertension: Secondary | ICD-10-CM

## 2023-04-27 DIAGNOSIS — E785 Hyperlipidemia, unspecified: Secondary | ICD-10-CM

## 2023-04-27 MED ORDER — OLMESARTAN MEDOXOMIL 40 MG PO TABS: 40.0000 mg | ORAL_TABLET | Freq: Every day | ORAL | 1 refills | Status: DC

## 2023-04-27 MED ORDER — ATORVASTATIN CALCIUM 20 MG PO TABS: 20.0000 mg | ORAL_TABLET | Freq: Every day | ORAL | 1 refills | Status: DC

## 2023-04-27 NOTE — Telephone Encounter (Signed)
Prescription Request  04/27/2023  Is this a "Controlled Substance" medicine? No  LOV: 04/12/2023  What is the name of the medication or equipment? *PT requesting 90 day supply of both*   atorvastatin (LIPITOR) 20 MG tablet   olmesartan (BENICAR) 40 MG tablet   Have you contacted your pharmacy to request a refill? Yes   Which pharmacy would you like this sent to?  [facility] [address] [place], [place] - [address] AT [address] [place]. [place] [address] Phone: [phone] Fax: [phone]   Patient notified that their request is being sent to the clinical staff for review and that they should receive a response within 2 business days.   Please advise at [facility] [phone]

## 2023-04-27 NOTE — Telephone Encounter (Signed)
Refills sent

## 2023-04-27 NOTE — Addendum Note (Signed)
Addended by: Roxanne Gates on: 04/27/2023 11:10 AM   Modules accepted: Orders

## 2023-05-01 ENCOUNTER — Ambulatory Visit: Payer: BLUE CROSS/BLUE SHIELD | Admitting: Family Medicine

## 2023-05-03 ENCOUNTER — Ambulatory Visit: Payer: BLUE CROSS/BLUE SHIELD | Admitting: Family Medicine

## 2023-05-03 ENCOUNTER — Encounter: Payer: Self-pay | Admitting: Family Medicine

## 2023-05-03 ENCOUNTER — Other Ambulatory Visit (HOSPITAL_COMMUNITY)
Admission: RE | Admit: 2023-05-03 | Discharge: 2023-05-03 | Disposition: A | Discharge status: Home / Self Care | Payer: BLUE CROSS/BLUE SHIELD | Source: Ambulatory Visit | Attending: Family Medicine | Admitting: Family Medicine

## 2023-05-03 VITALS — BP 124/80 | HR 81 | Temp 98.8°F | Resp 16 | Ht 65.0 in | Wt 174.8 lb

## 2023-05-03 DIAGNOSIS — Z124 Encounter for screening for malignant neoplasm of cervix: Secondary | ICD-10-CM | POA: Diagnosis not present

## 2023-05-03 DIAGNOSIS — Z0000 Encounter for general adult medical examination without abnormal findings: Secondary | ICD-10-CM | POA: Diagnosis present

## 2023-05-03 DIAGNOSIS — Z01419 Encounter for gynecological examination (general) (routine) without abnormal findings: Secondary | ICD-10-CM

## 2023-05-03 NOTE — Progress Notes (Signed)
Established Patient Office Visit  Subjective   Patient ID: Stacy Bond, female    DOB: 08-19-1960  Age: 62 y.o. MRN: [id number]  Chief Complaint  Patient presents with   Gynecologic Exam    HPI Discussed the use of AI scribe software for clinical note transcription with the patient, who gave verbal consent to proceed.  History of Present Illness   The patient presented for a follow-up consultation after a recent visit with Dr. Christell Faith. She discussed a neck pillow, which the patient found beneficial. The patient is planning to travel soon and expressed concerns about potential health risks associated with air travel, such as pneumonia and high fevers.  The patient underwent a physical examination, which seemed to cause some discomfort. The patient was tense during the procedure and was advised to relax. 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  She also discussed a product called a "neck roll," which is a foam device used for neck support. The patient was interested in this product and planned to inform Dr. Christell Faith about it.  The patient was hopeful that she would not need another examination for three to five years. She expressed a wish for a pill that could prevent the need for such procedures. The patient also made a humorous comparison between the advancements in space travel and the lack of advancements in medical screening procedures.      Patient Active Problem List   Diagnosis Date Noted   Preventative health care 04/12/2023   Cervical radiculopathy 01/16/2022   Cervical strain 12/06/2021   Myofascial pain syndrome of thoracic spine 12/06/2021   Essential hypertension 08/06/2018   Tooth abscess 08/06/2018   Hyperlipidemia 09/01/2017   Slow transit constipation 08/31/2017   Fibroid 03/14/2017   Chest pain 04/15/2013   Chronic serous otitis media 03/20/2011   Seasonal  allergies 03/16/2011   Past Medical History:  Diagnosis Date   Anemia    Fibroid    Hemorrhoids    Hypertension    No past surgical history on file. Social History   Tobacco Use   Smoking status: Never   Smokeless tobacco: Never  Vaping Use   Vaping status: Never Used  Substance Use Topics   Alcohol use: Yes    Alcohol/week: 2.0 standard drinks of alcohol    Types: 2 Glasses of wine per week   Drug use: No   Social History   Socioeconomic History   Marital status: Married    Spouse name: Not on file   Number of children: 1   Years of education: Not on file   Highest education level: Not on file  Occupational History   Not on file  Tobacco Use   Smoking status: Never   Smokeless tobacco: Never  Vaping Use   Vaping status: Never Used  Substance and Sexual Activity   Alcohol use: Yes    Alcohol/week: 2.0 standard drinks of alcohol    Types: 2 Glasses of wine per week   Drug use: No   Sexual activity: Yes    Partners: Male  Other Topics Concern   Not on file  Social History Narrative   Not on file   Social Determinants of Health   Financial Resource Strain: Not on file  Food Insecurity: Not on file  Transportation Needs: Not on file  Physical Activity: Not on file  Stress: Not on file  Social Connections: Not on file  Intimate Partner Violence: Not on file  Family Status  Relation Name Status   Father  Alive   Mother  Deceased   Brother  Deceased       accident   Daughter  Alive  No partnership data on file   Family History  Problem Relation Age of Onset   Diabetes Father    Hypertension Mother    Pulmonary embolism Mother    Pulmonary embolism Brother    Clotting disorder Daughter    No Known Allergies    Review of Systems  Constitutional:  Negative for chills, fever and malaise/fatigue.  HENT:  Negative for congestion and hearing loss.   Eyes:  Negative for blurred vision and discharge.  Respiratory:  Negative for cough, sputum  production and shortness of breath.   Cardiovascular:  Negative for chest pain, palpitations and leg swelling.  Gastrointestinal:  Negative for abdominal pain, blood in stool, constipation, diarrhea, heartburn, nausea and vomiting.  Genitourinary:  Negative for dysuria, frequency, hematuria and urgency.  Musculoskeletal:  Negative for back pain, falls and myalgias.  Skin:  Negative for rash.  Neurological:  Negative for dizziness, sensory change, loss of consciousness, weakness and headaches.  Endo/Heme/Allergies:  Negative for environmental allergies. Does not bruise/bleed easily.  Psychiatric/Behavioral:  Negative for depression and suicidal ideas. The patient is not nervous/anxious and does not have insomnia.       Objective:     BP 124/80 (BP Location: Left Arm, Patient Position: Sitting, Cuff Size: Normal)   Pulse 81   Temp 98.8 F (37.1 C) (Oral)   Resp 16   Ht 5\' 5"  (1.651 m)   Wt 174 lb 12.8 oz (79.3 kg)   SpO2 100%   BMI 29.09 kg/m  BP Readings from Last 3 Encounters:  05/03/23 124/80  04/12/23 122/80  08/29/22 (!) 136/100   Wt Readings from Last 3 Encounters:  05/03/23 174 lb 12.8 oz (79.3 kg)  04/12/23 173 lb (78.5 kg)  08/29/22 182 lb 6.4 oz (82.7 kg)   SpO2 Readings from Last 3 Encounters:  05/03/23 100%  04/12/23 98%  08/29/22 98%      Physical Exam Vitals and nursing note reviewed. Exam conducted with a chaperone present.  Constitutional:      Appearance: She is well-developed.  Neck:     Thyroid: No thyromegaly or thyroid tenderness.     Vascular: No JVD.  Genitourinary:    General: Normal vulva.     Exam position: Lithotomy position.     Labia:        Right: No rash, tenderness, lesion or injury.        Left: No rash, tenderness, lesion or injury.      Vagina: Normal.     Cervix: Normal.     Uterus: Normal.      Adnexa: Right adnexa normal and left adnexa normal.     Rectum: Normal. Guaiac result negative.  Musculoskeletal:        General:  Normal range of motion.     Right lower leg: No edema.     Left lower leg: No edema.  Skin:    General: Skin is warm and dry.     Findings: No erythema or rash.  Neurological:     Mental Status: She is alert and oriented to person, place, and time.     Cranial Nerves: No cranial nerve deficit.     Deep Tendon Reflexes: Reflexes are normal and symmetric.  Psychiatric:        Mood and Affect: Mood normal.  Behavior: Behavior normal.        Thought Content: Thought content normal.        Judgment: Judgment normal.      No results found for any visits on 05/03/23.  Last CBC Lab Results  Component Value Date   WBC 7.0 04/12/2023   HGB 12.2 04/12/2023   HCT 39.8 04/12/2023   MCV 74.9 (L) 04/12/2023   MCH 23.4 (L) 01/20/2022   RDW 15.4 04/12/2023   PLT 232.0 04/12/2023   Last metabolic panel Lab Results  Component Value Date   GLUCOSE 96 04/12/2023   NA 140 04/12/2023   K 4.0 04/12/2023   CL 97 04/12/2023   CO2 34 (H) 04/12/2023   BUN 24 (H) 04/12/2023   CREATININE 1.30 (H) 04/12/2023   GFR 44.55 (L) 04/12/2023   CALCIUM 10.2 04/12/2023   PROT 7.9 04/12/2023   ALBUMIN 4.7 04/12/2023   BILITOT 0.8 04/12/2023   ALKPHOS 89 04/12/2023   AST 21 04/12/2023   ALT 19 04/12/2023   ANIONGAP 8 01/20/2022   Last lipids Lab Results  Component Value Date   CHOL 211 (H) 04/12/2023   HDL 67.60 04/12/2023   LDLCALC 123 (H) 04/12/2023   LDLDIRECT 192.0 02/22/2017   TRIG 103.0 04/12/2023   CHOLHDL 3 04/12/2023   Last hemoglobin A1c No results found for: "HGBA1C" Last thyroid functions Lab Results  Component Value Date   TSH 0.79 04/12/2023   Last vitamin D No results found for: "25OHVITD2", "25OHVITD3", "VD25OH" Last vitamin B12 and Folate No results found for: "VITAMINB12", "FOLATE"    The 10-year ASCVD risk score (Arnett DK, et al., 2019) is: 6.3%    Assessment & Plan:   Problem List Items Addressed This Visit   None Visit Diagnoses     Encounter for  gynecological examination without abnormal finding    -  Primary   Encounter for screening and preventative care       Relevant Orders   Cytology - PAP( [place])     Pap done today   Assessment and Plan    Colorectal Cancer Screening   She underwent a successful colorectal cancer screening. We will await the results, which are expected in about a week, and then interpret and communicate these results to her.  Travel Precautions   She is planning to travel next Monday and has concerns about infection risk. We advised her on the use of masks and hand sanitizer during her travel.  General Health Maintenance   We discussed the use of a neck roll for comfort and advised her to inform Dr. Christell Faith about the neck roll.        No follow-ups on file.    Donato Schultz, DO

## 2023-05-09 LAB — CYTOLOGY - PAP
Adequacy: ABSENT
Chlamydia: NEGATIVE
Comment: NEGATIVE
Comment: NEGATIVE
Comment: NEGATIVE
Comment: NORMAL
Diagnosis: NEGATIVE
HSV1: NEGATIVE
HSV2: NEGATIVE
Neisseria Gonorrhea: NEGATIVE
Trichomonas: NEGATIVE

## 2023-06-20 ENCOUNTER — Emergency Department (HOSPITAL_BASED_OUTPATIENT_CLINIC_OR_DEPARTMENT_OTHER): Payer: No Typology Code available for payment source

## 2023-06-20 ENCOUNTER — Emergency Department (HOSPITAL_BASED_OUTPATIENT_CLINIC_OR_DEPARTMENT_OTHER)
Admission: EM | Admit: 2023-06-20 | Discharge: 2023-06-20 | Disposition: A | Discharge status: Home / Self Care | Payer: No Typology Code available for payment source | Attending: Emergency Medicine | Admitting: Emergency Medicine

## 2023-06-20 ENCOUNTER — Other Ambulatory Visit: Payer: Self-pay

## 2023-06-20 ENCOUNTER — Encounter (HOSPITAL_BASED_OUTPATIENT_CLINIC_OR_DEPARTMENT_OTHER): Payer: Self-pay | Admitting: Emergency Medicine

## 2023-06-20 DIAGNOSIS — I10 Essential (primary) hypertension: Secondary | ICD-10-CM | POA: Diagnosis not present

## 2023-06-20 DIAGNOSIS — E876 Hypokalemia: Secondary | ICD-10-CM | POA: Insufficient documentation

## 2023-06-20 DIAGNOSIS — Z79899 Other long term (current) drug therapy: Secondary | ICD-10-CM | POA: Insufficient documentation

## 2023-06-20 DIAGNOSIS — R0789 Other chest pain: Secondary | ICD-10-CM | POA: Diagnosis not present

## 2023-06-20 DIAGNOSIS — R079 Chest pain, unspecified: Secondary | ICD-10-CM | POA: Diagnosis present

## 2023-06-20 LAB — CBC
HCT: 40.4 % (ref 36.0–46.0)
Hemoglobin: 12.7 g/dL (ref 12.0–15.0)
MCH: 23.4 pg — ABNORMAL LOW (ref 26.0–34.0)
MCHC: 31.4 g/dL (ref 30.0–36.0)
MCV: 74.4 fL — ABNORMAL LOW (ref 80.0–100.0)
Platelets: 240 10*3/uL (ref 150–400)
RBC: 5.43 MIL/uL — ABNORMAL HIGH (ref 3.87–5.11)
RDW: 15.3 % (ref 11.5–15.5)
WBC: 7.7 10*3/uL (ref 4.0–10.5)
nRBC: 0 % (ref 0.0–0.2)

## 2023-06-20 LAB — BASIC METABOLIC PANEL
Anion gap: 10 (ref 5–15)
BUN: 27 mg/dL — ABNORMAL HIGH (ref 8–23)
CO2: 30 mmol/L (ref 22–32)
Calcium: 9.9 mg/dL (ref 8.9–10.3)
Chloride: 99 mmol/L (ref 98–111)
Creatinine, Ser: 1.38 mg/dL — ABNORMAL HIGH (ref 0.44–1.00)
GFR, Estimated: 44 mL/min — ABNORMAL LOW (ref >60)
Glucose, Bld: 104 mg/dL — ABNORMAL HIGH (ref 70–99)
Potassium: 3.4 mmol/L — ABNORMAL LOW (ref 3.5–5.1)
Sodium: 139 mmol/L (ref 135–145)

## 2023-06-20 LAB — TROPONIN I (HIGH SENSITIVITY)
Troponin I (High Sensitivity): 3 ng/L (ref <18)
Troponin I (High Sensitivity): 3 ng/L (ref <18)

## 2023-06-20 MED ORDER — ALUM & MAG HYDROXIDE-SIMETH 200-200-20 MG/5ML PO SUSP
30.0000 mL | Freq: Once | ORAL | Status: AC
  Administered 2023-06-20: 30 mL via ORAL
  Filled 2023-06-20: qty 30

## 2023-06-20 MED ORDER — LIDOCAINE VISCOUS HCL 2 % MT SOLN
15.0000 mL | Freq: Once | OROMUCOSAL | Status: AC
  Administered 2023-06-20: 15 mL via ORAL
  Filled 2023-06-20: qty 15

## 2023-06-20 MED ORDER — ASPIRIN 81 MG PO CHEW
324.0000 mg | CHEWABLE_TABLET | Freq: Once | ORAL | Status: AC
  Administered 2023-06-20: 324 mg via ORAL
  Filled 2023-06-20: qty 4

## 2023-06-20 MED ORDER — IOHEXOL 350 MG/ML SOLN
80.0000 mL | Freq: Once | INTRAVENOUS | Status: AC | PRN
  Administered 2023-06-20: 80 mL via INTRAVENOUS

## 2023-06-20 NOTE — ED Provider Notes (Signed)
 [place] [facility] Provider Note   CSN: [id number] Arrival date & time: 06/20/23  1542     History  Chief Complaint  Patient presents with   Chest Pain    Stacy Bond is a 63 y.o. female.  With a history of anemia, hypertension presenting to the ED for evaluation of chest pain.  Symptoms began yesterday morning.  Pain is described as a pressure.  It is substernal with radiation between the shoulder blades in the back.  It is constant.  It is not exertional although she does report some heaviness to bilateral lower extremities with ambulation.  She denies any shortness of breath.  No cough.  She flew back to the [place]  from [place] 7 days ago.  She denies any nausea, vomiting or diaphoresis.  History of hypertension but no history of CAD.  She reports extensive family history of heart disease.  States that her brother died of a heart attack 2 years ago.  She denies any unilateral leg swelling or pain.   Chest Pain      Home Medications Prior to Admission medications   Medication Sig Start Date End Date Taking? Authorizing Provider  amLODipine  (NORVASC ) 5 MG tablet Take 1 tablet (5 mg total) by mouth daily. 04/12/23   Antonio Cyndee Jamee JONELLE, DO  atorvastatin  (LIPITOR) 20 MG tablet Take 1 tablet (20 mg total) by mouth daily. 04/27/23   Antonio Cyndee Jamee JONELLE, DO  famciclovir  (FAMVIR ) 500 MG tablet Take 1 tablet (500 mg total) by mouth 3 (three) times daily. 03/31/21   Saguier, [place], PA-C  meloxicam  (MOBIC ) 15 MG tablet Take 1 tablet (15 mg total) by mouth daily as needed. 08/29/22   Antonio Cyndee Jamee JONELLE, DO  methocarbamol  (ROBAXIN ) 500 MG tablet Take 1 tablet (500 mg total) by mouth 4 (four) times daily. 08/29/22   Antonio Cyndee Jamee JONELLE, DO  olmesartan  (BENICAR ) 40 MG tablet Take 1 tablet (40 mg total) by mouth daily. 04/27/23   Lowne Chase, Yvonne R, DO      Allergies    Patient has no known allergies.    Review of Systems   Review of  Systems  Cardiovascular:  Positive for chest pain.  All other systems reviewed and are negative.   Physical Exam Updated Vital Signs BP 131/85   Pulse 76   Temp (!) 97.5 F (36.4 C) (Oral)   Resp 18   Wt 77.1 kg   SpO2 100%   BMI 28.29 kg/m  Physical Exam Vitals and nursing note reviewed.  Constitutional:      General: She is not in acute distress.    Appearance: She is well-developed.     Comments: Resting comfortably in bed  HENT:     Head: Normocephalic and atraumatic.  Eyes:     Conjunctiva/sclera: Conjunctivae normal.  Cardiovascular:     Rate and Rhythm: Normal rate and regular rhythm.     Pulses:          Radial pulses are 2+ on the right side and 2+ on the left side.     Heart sounds: No murmur heard. Pulmonary:     Effort: Pulmonary effort is normal. No respiratory distress.     Breath sounds: Normal breath sounds. No decreased breath sounds, wheezing, rhonchi or rales.  Abdominal:     Palpations: Abdomen is soft.     Tenderness: There is no abdominal tenderness.  Musculoskeletal:        General: No  swelling.     Cervical back: Neck supple.     Right lower leg: No edema.     Left lower leg: No edema.  Skin:    General: Skin is warm and dry.     Capillary Refill: Capillary refill takes less than 2 seconds.  Neurological:     General: No focal deficit present.     Mental Status: She is alert and oriented to person, place, and time.  Psychiatric:        Mood and Affect: Mood normal.     ED Results / Procedures / Treatments   Labs (all labs ordered are listed, but only abnormal results are displayed) Labs Reviewed  BASIC METABOLIC PANEL - Abnormal; Notable for the following components:      Result Value   Potassium 3.4 (*)    Glucose, Bld 104 (*)    BUN 27 (*)    Creatinine, Ser 1.38 (*)    GFR, Estimated 44 (*)    All other components within normal limits  CBC - Abnormal; Notable for the following components:   RBC 5.43 (*)    MCV 74.4 (*)     MCH 23.4 (*)    All other components within normal limits  TROPONIN I (HIGH SENSITIVITY)  TROPONIN I (HIGH SENSITIVITY)    EKG EKG Interpretation Date/Time:  Wednesday June 20 2023 16:03:11 EST Ventricular Rate:  78 PR Interval:  180 QRS Duration:  80 QT Interval:  398 QTC Calculation: 453 R Axis:   28  Text Interpretation: Normal sinus rhythm nonspecific T waves no significant change since 2023 Confirmed by Freddi Hamilton [phone]) on 06/20/2023 4:06:16 PM  Radiology CT Angio Chest/Abd/Pel for Dissection W and/or W/WO Result Date: 06/20/2023 CLINICAL DATA:  Chest pain, back pain which radiates to her LEFT arm. Concern for acute aortic syndrome. EXAM: CT ANGIOGRAPHY CHEST, ABDOMEN AND PELVIS TECHNIQUE: Non-contrast CT of the chest was initially obtained. Multidetector CT imaging through the chest, abdomen and pelvis was performed using the standard protocol during bolus administration of intravenous contrast. Multiplanar reconstructed images and MIPs were obtained and reviewed to evaluate the vascular anatomy. RADIATION DOSE REDUCTION: This exam was performed according to the departmental dose-optimization program which includes automated exposure control, adjustment of the mA and/or kV according to patient size and/or use of iterative reconstruction technique. CONTRAST:  80mL OMNIPAQUE  IOHEXOL  350 MG/ML SOLN COMPARISON:  None Available. FINDINGS: CTA CHEST FINDINGS Cardiovascular: Non IV contrast imaging demonstrates no intramural hematoma within the thoracic aorta. Contrast imaging demonstrates no aortic dissection or aneurysm. Bovine arch anatomy. No pericardial fluid. No filling defects within the pulmonary arteries to suggest acute pulmonary embolism. Mediastinum/Nodes: No axillary or supraclavicular adenopathy. No mediastinal or hilar adenopathy. No pericardial fluid. Esophagus normal. Lungs/Pleura: No pulmonary infarction. No pneumonia. No pleural fluid. No pneumothorax Musculoskeletal: No  aggressive osseous lesion. Review of the MIP images confirms the above findings. CTA ABDOMEN AND PELVIS FINDINGS VASCULAR Aorta: Normal caliber aorta without aneurysm, dissection, vasculitis or significant stenosis. Celiac: Patent without evidence of aneurysm, dissection, vasculitis or significant stenosis. SMA: Patent without evidence of aneurysm, dissection, vasculitis or significant stenosis. Renals: Both renal arteries are patent without evidence of aneurysm, dissection, vasculitis, fibromuscular dysplasia or significant stenosis. IMA: Patent without evidence of aneurysm, dissection, vasculitis or significant stenosis. Inflow: Patent without evidence of aneurysm, dissection, vasculitis or significant stenosis. Veins: No obvious venous abnormality within the limitations of this arterial phase study. Review of the MIP images confirms the above findings. NON-VASCULAR Hepatobiliary: No  focal hepatic lesion. Normal gallbladder. No biliary duct dilatation. Common bile duct is normal. Pancreas: Pancreas is normal. No ductal dilatation. No pancreatic inflammation. Spleen: Normal spleen Adrenals/urinary tract: Adrenal glands and kidneys are normal. The ureters and bladder normal. Stomach/Bowel: Stomach, small bowel, appendix, and cecum are normal. The colon and rectosigmoid colon are normal. Vascular/Lymphatic: Abdominal aorta is normal caliber. No periportal or retroperitoneal adenopathy. No pelvic adenopathy. Reproductive: Uterus is enlarged measuring 13.7 12.2 x 9.6 cm. The uterus extends the level the umbilicus. 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 No adnexal abnormality. Other: No free fluid. Musculoskeletal: No aggressive osseous lesion. Review of the MIP images confirms the above findings. IMPRESSION: CHEST: 1. No evidence of aortic dissection or aneurysm. 2. No acute pulmonary embolism. 3. No acute pulmonary findings. PELVIS: 1. No evidence of aortic  dissection or aneurysm. 2. Enlarged uterus with large calcified leiomyoma. Electronically Signed   By: Jackquline Boxer M.D.   On: 06/20/2023 18:47   DG Chest 2 View Result Date: 06/20/2023 CLINICAL DATA:  Chest pain EXAM: CHEST - 2 VIEW COMPARISON:  10/26/2020 FINDINGS: The heart size and mediastinal contours are within normal limits. Both lungs are clear. The visualized skeletal structures are unremarkable. Probable hair artifact over the right apex. IMPRESSION: No active cardiopulmonary disease. Electronically Signed   By: Luke Bun M.D.   On: 06/20/2023 16:30    Procedures Procedures    Medications Ordered in ED Medications  aspirin  chewable tablet 324 mg (324 mg Oral Given 06/20/23 1656)  iohexol  (OMNIPAQUE ) 350 MG/ML injection 80 mL (80 mLs Intravenous Contrast Given 06/20/23 1716)  alum & mag hydroxide-simeth (MAALOX/MYLANTA) 200-200-20 MG/5ML suspension 30 mL (30 mLs Oral Given 06/20/23 1848)    And  lidocaine  (XYLOCAINE ) 2 % viscous mouth solution 15 mL (15 mLs Oral Given 06/20/23 1848)    ED Course/ Medical Decision Making/ A&P             HEART Score: 3                    Medical Decision Making Amount and/or Complexity of Data Reviewed Labs: ordered. Radiology: ordered.  Risk OTC drugs. Prescription drug management.  This patient presents to the ED for concern of chest pain, this involves an extensive number of treatment options, and is a complaint that carries with it a high risk of complications and morbidity.  The emergent differential diagnosis of chest pain includes: Acute coronary syndrome, pericarditis, aortic dissection, pulmonary embolism, tension pneumothorax, and esophageal rupture.  other urgent/non-acute considerations include, but are not limited to: chronic angina, aortic stenosis, cardiomyopathy, myocarditis, mitral valve prolapse, pulmonary hypertension, hypertrophic obstructive cardiomyopathy (HOCM), aortic insufficiency, right ventricular hypertrophy,  pneumonia, pleuritis, bronchitis, pneumothorax, tumor, gastroesophageal reflux disease (GERD), esophageal spasm, Mallory-Weiss syndrome, peptic ulcer disease, biliary disease, pancreatitis, functional gastrointestinal pain, cervical or thoracic disk disease or arthritis, shoulder arthritis, costochondritis, subacromial bursitis, anxiety or panic attack, herpes zoster, breast disorders, chest wall tumors, thoracic outlet syndrome, mediastinitis.  My initial workup includes ACS rule out, aspirin , dissection study  Additional history obtained from: Nursing notes from this visit. Family husband at bedside provides portion of the history  I ordered, reviewed and interpreted labs which include: CBC, BMP, troponin, delta troponin.  No leukocytosis or anemia.  Mild hypokalemia of 3.4.  BUN elevated to 27 with a creatinine of 1.38 similar to reading from 2 months ago.  Initial and delta troponin negative.  I ordered imaging studies including chest x-ray, CT dissection study I independently visualized and interpreted imaging which showed no acute abnormalities I agree with radiologist interpretation  Cardiac Monitoring:  The patient was maintained on a cardiac monitor.  I personally viewed and interpreted the cardiac monitored which showed an underlying rhythm of: NSR  Afebrile, hemodynamically stable.  63 year old female presenting to the ED for evaluation of chest pain.  Substernal with radiation to the mid upper back.  Described as a pressure.  Symptoms began yesterday.  Symptoms and not exertional.  Reports a history of hypertension but no other cardiac history.  Reports a significant family cardiac history.  She is wondering if her symptoms may be secondary to gas.  Lab workup was reassuring.  Creatinine elevated but near most recent reading from 2 months ago.  EKG without acute ischemic changes.  Chest x-ray reassuring.  Initial delta troponin negative.  CT dissection study negative for dissection, PE  or acute cardiopulmonary or abdominal abnormalities.  Overall low suspicion for ACS or PE.  Cardiology referral order was placed.  She was encouraged to follow-up.  She was also encouraged to follow-up with her primary care provider regarding her elevated creatinine.  She was given return precautions.  Stable at discharge.  At this time there does not appear to be any evidence of an acute emergency medical condition and the patient appears stable for discharge with appropriate outpatient follow up. Diagnosis was discussed with patient who verbalizes understanding of care plan and is agreeable to discharge. I have discussed return precautions with patient and husband who verbalizes understanding. Patient encouraged to follow-up with their PCP within 1 week and cardiology. All questions answered.  Patient's case discussed with Dr. Freddi who agrees with plan.   Note: Portions of this report may have been transcribed using voice recognition software. Every effort was made to ensure accuracy; however, inadvertent computerized transcription errors may still be present.         Final Clinical Impression(s) / ED Diagnoses Final diagnoses:  Atypical chest pain    Rx / DC Orders ED Discharge Orders          Ordered    Ambulatory referral to Cardiology       Comments: If you have not heard from the Cardiology office within the next 72 hours please call [phone].   06/20/23 1856              Edwardo Marsa HERO, PA-C 06/20/23 1857    Freddi Hamilton, MD 06/20/23 [phone]

## 2023-06-20 NOTE — Discharge Instructions (Addendum)
 You have been seen today for your complaint of chest pain. Your lab work showed slightly worsened kidney function but was otherwise reassuring. Your imaging was overall reassuring. Follow up with: your primary care provider regarding your kidney function. Follow up with cardiology as well. We have placed a referral order. They should call you to schedule a follow up appointment. Please seek immediate medical care if you develop any of the following symptoms: Your chest pain gets worse. You have a cough that gets worse, or you cough up blood. You have severe pain in your abdomen. You faint. You have sudden, unexplained chest discomfort. You have sudden, unexplained discomfort in your arms, back, neck, or jaw. You have shortness of breath at any time. You suddenly start to sweat, or your skin gets clammy. You feel nausea or you vomit. You suddenly feel lightheaded or dizzy. You have severe weakness, or unexplained weakness or fatigue. Your heart begins to beat quickly, or it feels like it is skipping beats. At this time there does not appear to be the presence of an emergent medical condition, however there is always the potential for conditions to change. Please read and follow the below instructions.  Do not take your medicine if  develop an itchy rash, swelling in your mouth or lips, or difficulty breathing; call 911 and seek immediate emergency medical attention if this occurs.  You may review your lab tests and imaging results in their entirety on your MyChart account.  Please discuss all results of fully with your primary care provider and other specialist at your follow-up visit.  Note: Portions of this text may have been transcribed using voice recognition software. Every effort was made to ensure accuracy; however, inadvertent computerized transcription errors may still be present.

## 2023-06-20 NOTE — ED Triage Notes (Signed)
 Mid chest pain/ heaviness  radiating to left arm and across to her back since yesterday , nausea . Hx HTN .  Denies shortness of breath .  Feels heavy when walking she said . No weakness
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
## 2023-06-26 ENCOUNTER — Other Ambulatory Visit (HOSPITAL_BASED_OUTPATIENT_CLINIC_OR_DEPARTMENT_OTHER): Payer: Self-pay

## 2023-08-02 ENCOUNTER — Ambulatory Visit: Payer: BLUE CROSS/BLUE SHIELD

## 2023-08-02 ENCOUNTER — Ambulatory Visit: Payer: Self-pay

## 2023-08-08 ENCOUNTER — Encounter: Payer: Self-pay | Admitting: Cardiology

## 2023-08-08 ENCOUNTER — Ambulatory Visit: Payer: Commercial Managed Care - PPO | Attending: Cardiology | Admitting: Cardiology

## 2023-08-08 VITALS — BP 122/86 | HR 84 | Ht 64.0 in | Wt 174.0 lb

## 2023-08-08 DIAGNOSIS — E782 Mixed hyperlipidemia: Secondary | ICD-10-CM

## 2023-08-08 DIAGNOSIS — Z8249 Family history of ischemic heart disease and other diseases of the circulatory system: Secondary | ICD-10-CM | POA: Diagnosis not present

## 2023-08-08 DIAGNOSIS — R072 Precordial pain: Secondary | ICD-10-CM

## 2023-08-08 DIAGNOSIS — I10 Essential (primary) hypertension: Secondary | ICD-10-CM | POA: Diagnosis not present

## 2023-08-08 NOTE — Patient Instructions (Addendum)
 Medication Instructions:   Your physician recommends that you continue on your current medications as directed. Please refer to the Current Medication list given to you today.  *If you need a refill on your cardiac medications before your next appointment, please call your pharmacy*   Lab Work:  IN MAY 2025 AT ANY [facility]--SEE ATTACHED [facility] LOCATIONS--LIPIDS--PLEASE GO FASTING TO THIS LAB APPOINTMENT  If you have labs (blood work) drawn today and your tests are completely normal, you will receive your results only by: MyChart Message (if you have MyChart) OR A paper copy in the mail If you have any lab test that is abnormal or we need to change your treatment, we will call you to review the results.   Testing/Procedures:  WE WILL ORDER FOR YOU TO GET A CARDIAC CT DONE, WHEN YOU VERIFY YOUR CURRENT INSURANCE COVERAGE AND CALL us WITH THIS.  ONCE INSURANCE VERIFIED, WE CAN ORDER THE CARDIAC CT AND GET THIS PRE-CERTED/SCHEDULED FOR YOU THEREAFTER    Follow-Up:  IN JUNE 2025 WITH DR. PATWARDHAN     Other Instructions [facility] Contact for Alternative locations and appointment scheduling   [email or web address]   [email or web address]  [phone]         1st Floor: - Lobby - Registration  - Pharmacy  - Lab - Cafe  2nd Floor: - PV Lab - Diagnostic Testing (echo, CT, nuclear med)  3rd Floor: - Vacant  4th Floor: - TCTS (cardiothoracic surgery) - AFib Clinic - Structural Heart Clinic - Vascular Surgery  - Vascular Ultrasound  5th Floor: - [facility] Cardiology (general and EP) - Clinical Pharmacy for coumadin, hypertension, lipid, weight-loss medications, and med management appointments    Valet parking services will be available as well.

## 2023-08-08 NOTE — Progress Notes (Signed)
 Cardiology Office Note:  .   Date:  08/08/2023  ID:  Stacy Bond, DOB 1960/07/20, MRN [id number] PCP: Zola Button, Grayling Congress, DO   [facility] Cardiologist:  Truett Mainland, MD PCP: Zola Button, Grayling Congress, DO  Chief Complaint  Patient presents with   Chest Pain   Discussed the use of AI scribe software for clinical note transcription with the patient, who gave verbal consent to proceed.    History of Present Illness: .    Stacy Bond is a 63 y.o. female with hypertension, hyperlipidemia, chest pain  The patient, with a significant family history of heart disease, presents with a history of chest pain that was severe and uncomfortable, described as feeling like the chest was about to break in half. The pain was more from the back, not so much from the front, and started getting worse after a period of feeling slightly uncomfortable, similar to muscle ache after exercise. The chest pain lasted for about three days, subsiding on its own. The patient has not experienced any chest pain since then. The patient also reports experiencing heartburn, mainly in the evenings, lasting for about ten to fifteen minutes. The heartburn seems to occur after consuming wine. The patient has not experienced any heartburn since eliminating wine from their diet. The patient is currently on atorvastatin for cholesterol management.  Vitals:   08/08/23 1058  BP: 122/86  Pulse: 84     ROS:  Review of Systems  Cardiovascular:  Positive for chest pain. Negative for dyspnea on exertion, leg swelling, palpitations and syncope.     Studies Reviewed: Marland Kitchen        EKG 08/08/2023: Normal sinus rhythm Nonspecific T wave abnormality When compared with ECG of 20-Jun-2023 16:03, No significant change was found  Independently interpreted 03/2023-06/2023: Chol 211, TG 103, HDL 67, LDL 123 Hb 12.7 Cr 1.38, eGFR 44, K 3.4 TSH 0.79    Physical Exam:   Physical Exam Vitals and nursing note  reviewed.  Constitutional:      General: She is not in acute distress. Neck:     Vascular: No JVD.  Cardiovascular:     Rate and Rhythm: Normal rate and regular rhythm.     Heart sounds: Normal heart sounds. No murmur heard. Pulmonary:     Effort: Pulmonary effort is normal.     Breath sounds: Normal breath sounds. No wheezing or rales.  Musculoskeletal:     Right lower leg: No edema.     Left lower leg: No edema.      VISIT DIAGNOSES:   ICD-10-CM   1. Essential hypertension  I10 EKG 12-Lead    Lipid Profile    Lipid Profile    2. Mixed hyperlipidemia  E78.2 Lipid Profile    Lipid Profile    3. Precordial pain  R07.2 Lipid Profile    Lipid Profile    4. Family history of early CAD  Z47.49 Lipid Profile    Lipid Profile       ASSESSMENT AND PLAN: .    Stacy Bond is a 63 y.o. female with hypertension, hyperlipidemia, chest pain  Chest pain:  Patient experienced a three-day episode of chest discomfort, described as feeling like her chest was "breaking in half." The pain was worse at night and after eating certain foods. The pain has since subsided. Family history of heart disease is significant. However, the pain is not exertional and the patient's physical examination does not suggest a cardiac etiology. The most likely cause  is musculoskeletal, possibly costochondritis. -Order coronary CT angiogram to assess for any coronary artery disease given the family history.  Mixed hyperlipidemia:  Patient is currently on Atorvastatin 20mg . Cholesterol levels are not optimal, but patient reports dietary changes to improve them. -Continue Atorvastatin 20mg . -Check lipid panel in May. -Consider increasing Atorvastatin dose depending on coronary CT angiogram results.  Gastroesophageal Reflux Disease:  Patient reports recent onset of heartburn, primarily in the evenings, lasting 10-15 minutes. Symptoms seem to improve with dietary changes, specifically avoiding red  wine. -Continue dietary modifications. -Consider further evaluation if symptoms persist or worsen.  Follow-up in June to review results of coronary CT angiogram and discuss any necessary changes to treatment plan.   F/u in 3 months  Signed, Elder Negus, MD

## 2023-08-10 ENCOUNTER — Telehealth: Payer: Self-pay | Admitting: *Deleted

## 2023-08-10 NOTE — Telephone Encounter (Signed)
 A message was left for Mrs.Gasser to schedule her follow-up appointment, with Dr.Patwardhan.

## 2023-08-15 ENCOUNTER — Telehealth: Payer: Self-pay | Admitting: *Deleted

## 2023-08-15 NOTE — Telephone Encounter (Signed)
-----   Message from NA Etta Grandchild sent at 08/10/2023 11:57 AM EST ----- Regarding: RE: NEEDS JUNE APPOINTMENT WITH DR. PATWARDHAN Stacy Bond, I left her a message to call back to schedule. ----- Message ----- From: Loa Socks, LPN Sent: 0/45/4098   5:05 PM EST To: Gerome Sam; Silas [place]; # Subject: FW: NEEDS JUNE APPOINTMENT WITH DR. PATWARDH#   ----- Message ----- From: Loa Socks, LPN Sent: 06/30/1476  12:17 PM EST To: Gerome Sam; Silas [place]; # Subject: NEEDS JUNE APPOINTMENT WITH DR. PATWARDHAN     Pt was seen in clinic today and Dr. Rosemary Holms wanted  her to come back in June to see him  She went to checkout but the appt was never made  Can you please call her and schedule June appt with Patwardhan and let me know the date?  Thanks, [employer]

## 2023-08-21 ENCOUNTER — Ambulatory Visit: Payer: BLUE CROSS/BLUE SHIELD

## 2023-09-13 ENCOUNTER — Telehealth: Payer: Self-pay | Admitting: Cardiology

## 2023-09-13 NOTE — Telephone Encounter (Signed)
 Patient is calling in about a test that the dr want her to have done. But I dont see no order for a test. Please advise

## 2023-09-13 NOTE — Telephone Encounter (Signed)
 Spoke with pt regarding her coronary CTA. Pt had been told at her last appointment (per AVS) that she needs to verify her insurance before the test will be ordered. Pt plans to send her insurance information via MyChart. Pt verbalized understanding. All questions, if any, were answered.

## 2023-09-27 ENCOUNTER — Ambulatory Visit: Admitting: Family Medicine

## 2023-10-11 ENCOUNTER — Telehealth: Payer: Self-pay | Admitting: Cardiology

## 2023-10-11 DIAGNOSIS — R072 Precordial pain: Secondary | ICD-10-CM

## 2023-10-11 NOTE — Telephone Encounter (Signed)
 Looks like we had discussed at last visit about pt having a cardiac CTA?

## 2023-10-11 NOTE — Telephone Encounter (Signed)
 Patient reported she has posted her insurance information to MyChart and want to get order for her test.

## 2023-10-12 NOTE — Telephone Encounter (Signed)
 Yes.  I recommended coronary CT angiogram at office visit in February 2025.  Thanks MJP

## 2023-10-15 ENCOUNTER — Other Ambulatory Visit: Payer: Self-pay | Admitting: Family Medicine

## 2023-10-15 ENCOUNTER — Encounter: Payer: Self-pay | Admitting: Family Medicine

## 2023-10-15 DIAGNOSIS — B354 Tinea corporis: Secondary | ICD-10-CM

## 2023-10-15 MED ORDER — NYSTATIN 100000 UNIT/GM EX CREA: 1.0000 | TOPICAL_CREAM | Freq: Two times a day (BID) | CUTANEOUS | 0 refills | Status: DC

## 2023-10-16 ENCOUNTER — Ambulatory Visit: Admitting: Family Medicine

## 2023-10-16 ENCOUNTER — Other Ambulatory Visit: Payer: Self-pay | Admitting: Family Medicine

## 2023-10-16 DIAGNOSIS — E785 Hyperlipidemia, unspecified: Secondary | ICD-10-CM

## 2023-10-16 DIAGNOSIS — I10 Essential (primary) hypertension: Secondary | ICD-10-CM

## 2023-10-17 NOTE — Telephone Encounter (Signed)
 Heart rate at last office visit as 80 bpm back in February. Protocol states for patient to receive metoprolol  tartrate 100 mg since we do not have a documented heart rate since February?

## 2023-10-19 DIAGNOSIS — R072 Precordial pain: Secondary | ICD-10-CM

## 2023-10-19 MED ORDER — METOPROLOL TARTRATE 100 MG PO TABS
100.0000 mg | ORAL_TABLET | Freq: Once | ORAL | 0 refills | Status: AC
Start: 2023-10-19 — End: 2023-10-19

## 2023-10-19 NOTE — Telephone Encounter (Signed)
 We can go by the February heart rate for metoprolol  dosing.  Thanks MJP

## 2023-10-22 NOTE — Telephone Encounter (Signed)
 Test ordered on 10/19/23, pt aware and instructions sent to MyChart.

## 2023-10-26 ENCOUNTER — Other Ambulatory Visit: Payer: Self-pay | Admitting: Family Medicine

## 2023-10-26 DIAGNOSIS — B354 Tinea corporis: Secondary | ICD-10-CM

## 2023-10-31 ENCOUNTER — Telehealth: Payer: Self-pay

## 2023-10-31 DIAGNOSIS — R072 Precordial pain: Secondary | ICD-10-CM

## 2023-10-31 NOTE — Telephone Encounter (Signed)
 Left message to call back. Need to know which [facility] location she will want to have her CTA completed at due to her insurance not covering a [place] location. Location options would be as follows:   - [address]  [phone]  - [address]  [phone]   Will need to know chose so that we can change the CTA order location.

## 2023-11-06 ENCOUNTER — Ambulatory Visit: Admitting: Family Medicine

## 2023-11-07 ENCOUNTER — Other Ambulatory Visit (HOSPITAL_COMMUNITY)

## 2023-11-07 NOTE — Telephone Encounter (Signed)
 Left message to call back

## 2023-11-08 ENCOUNTER — Ambulatory Visit (HOSPITAL_COMMUNITY)

## 2023-11-08 NOTE — Telephone Encounter (Signed)
 Pt called back in. She asked if you can send to the one on [facility]

## 2023-11-09 NOTE — Addendum Note (Signed)
 Addended by: Miles Allan B on: 11/09/2023 11:02 AM   Modules accepted: Orders

## 2023-11-16 ENCOUNTER — Telehealth: Payer: Self-pay

## 2023-11-16 NOTE — Telephone Encounter (Signed)
 Left message to call back. [facility] is unable to perform Coronary CTA. Need to know if she will be will to go to [facility] for the test.

## 2023-11-22 ENCOUNTER — Ambulatory Visit: Admitting: Family Medicine

## 2023-11-22 NOTE — Telephone Encounter (Signed)
 Left message to call back

## 2023-11-30 ENCOUNTER — Telehealth: Payer: Self-pay | Admitting: Cardiology

## 2023-11-30 NOTE — Telephone Encounter (Signed)
 Answered some questions about the order for the C-CTA. Also, the first available appointment they have for this scan is not until the first week of August. The Authorization is only good through 12/13/2023; therefore the Authorization will need to be extended or Re-authorized before they can Schedule this scan.   Will have to Re-fax this order and authorization information once the Authorization is extended.

## 2023-11-30 NOTE — Telephone Encounter (Signed)
 Caller Mona Angle) wants a call back regarding orders and prior authorization for patient's CT CORONARY MORPH test.

## 2023-11-30 NOTE — Telephone Encounter (Signed)
 Pt returning call, requesting cb

## 2023-11-30 NOTE — Telephone Encounter (Signed)
 Left message with [facility] CT department for return call for scheduling.

## 2023-11-30 NOTE — Telephone Encounter (Signed)
 Order faxed over to [facility] for scheduling. Confirmation returned of fax being received.

## 2023-11-30 NOTE — Telephone Encounter (Signed)
 Left message to call back

## 2023-11-30 NOTE — Telephone Encounter (Signed)
 Spoke with patient and she is okay with having testing performed at [facility]. Jolanda Nation advised and authorization in process.

## 2023-12-03 ENCOUNTER — Encounter: Payer: Self-pay | Admitting: Family Medicine

## 2023-12-03 ENCOUNTER — Other Ambulatory Visit: Payer: Self-pay | Admitting: Family Medicine

## 2023-12-03 ENCOUNTER — Ambulatory Visit: Admitting: Family Medicine

## 2023-12-03 MED ORDER — TRIAMCINOLONE ACETONIDE 0.1 % EX CREA: 1.0000 | TOPICAL_CREAM | Freq: Two times a day (BID) | CUTANEOUS | 0 refills | Status: AC

## 2023-12-11 ENCOUNTER — Ambulatory Visit: Admitting: Family Medicine

## 2023-12-19 NOTE — Telephone Encounter (Signed)
 New auth completed and now need to call, schedule, and fax order.

## 2023-12-21 NOTE — Telephone Encounter (Signed)
 Spoke to [facility] and appointment has been made for Coronary CTA on 01/08/24 arrival at 2:00 pm. Order, demographics, and pre-cert faxed to [facility] CT scheduling. Left two message for patient to advise appointment and that she needs to have her blood work performed next week before test.

## 2023-12-26 ENCOUNTER — Other Ambulatory Visit

## 2023-12-26 LAB — BASIC METABOLIC PANEL WITH GFR
BUN/Creatinine Ratio: 16 (ref 12–28)
BUN: 15 mg/dL (ref 8–27)
CO2: 23 mmol/L (ref 20–29)
Calcium: 9.9 mg/dL (ref 8.7–10.3)
Chloride: 102 mmol/L (ref 96–106)
Creatinine, Ser: 0.93 mg/dL (ref 0.57–1.00)
Glucose: 87 mg/dL (ref 70–99)
Potassium: 3.8 mmol/L (ref 3.5–5.2)
Sodium: 143 mmol/L (ref 134–144)
eGFR: 70 mL/min/1.73 (ref >59)

## 2024-01-03 ENCOUNTER — Ambulatory Visit: Admitting: Family Medicine

## 2024-01-11 ENCOUNTER — Telehealth: Payer: Self-pay | Admitting: Cardiology

## 2024-01-11 DIAGNOSIS — E782 Mixed hyperlipidemia: Secondary | ICD-10-CM

## 2024-01-11 DIAGNOSIS — I2510 Atherosclerotic heart disease of native coronary artery without angina pectoris: Secondary | ICD-10-CM | POA: Insufficient documentation

## 2024-01-11 DIAGNOSIS — E785 Hyperlipidemia, unspecified: Secondary | ICD-10-CM

## 2024-01-11 NOTE — Telephone Encounter (Signed)
 Mild nonobstructive CAD. Elevated calcium  score.  Does not need Aspirin , but continue statin. Ideally, recommend Lipitor 40 mg daily, with goal LDL <70. Can be followed up with PCP.  Thanks MJP

## 2024-01-11 NOTE — Telephone Encounter (Signed)
-----   Message from Nurse Lyle S sent at 01/09/2024  8:04 AM EDT ----- Coronary CTA that we ordered to be sent to [facility] due to insurance. ----- Message ----- From: Memory Nest, RMA Sent: 01/08/2024   4:55 PM EDT To: Newman JINNY Lawrence, MD; Lyle KATHEE Rigg, #

## 2024-01-11 NOTE — Telephone Encounter (Signed)
 Left message to call back

## 2024-01-14 MED ORDER — ATORVASTATIN CALCIUM 40 MG PO TABS: 40.0000 mg | ORAL_TABLET | Freq: Every day | ORAL | 3 refills | Status: AC

## 2024-01-14 NOTE — Addendum Note (Signed)
 Addended by: MANDA BOTTCHER B on: 01/14/2024 03:50 PM   Modules accepted: Orders

## 2024-01-14 NOTE — Telephone Encounter (Signed)
Please contact pt to schedule follow up appointment.  °

## 2024-01-14 NOTE — Telephone Encounter (Signed)
 Ok to do a non urgent f/u.  Thanks MJP

## 2024-01-14 NOTE — Telephone Encounter (Signed)
 Pt contacted and advised. Pt does not have a recall or scheduled follow up. Do you want to see her again? Pt was thinking to do one more follow up with you.

## 2024-01-30 ENCOUNTER — Telehealth: Payer: Self-pay | Admitting: Cardiology

## 2024-01-30 NOTE — Telephone Encounter (Signed)
 Called patient. Copied report from [facility] (care everywhere) to MyChart. Brooke RN has already notified patient of results

## 2024-01-30 NOTE — Telephone Encounter (Signed)
 Pt would like CT Scan results posted onto her MyChart. Please advise.

## 2024-01-31 NOTE — Telephone Encounter (Signed)
 Pt scheduled 03/25/24.

## 2024-03-25 ENCOUNTER — Ambulatory Visit: Admitting: Cardiology

## 2024-04-14 ENCOUNTER — Other Ambulatory Visit: Payer: Self-pay | Admitting: Family Medicine

## 2024-04-14 DIAGNOSIS — I10 Essential (primary) hypertension: Secondary | ICD-10-CM
